# Patient Record
Sex: Female | Born: 1980 | Race: Black or African American | Hispanic: No | Marital: Married | Smoking: Former smoker
Health system: Southern US, Community
[De-identification: ages and names within clinical notes are randomized; demographics above are authoritative.]

## PROBLEM LIST (undated history)

## (undated) DIAGNOSIS — E119 Type 2 diabetes mellitus without complications: Secondary | ICD-10-CM

## (undated) DIAGNOSIS — D649 Anemia, unspecified: Secondary | ICD-10-CM

## (undated) DIAGNOSIS — Z862 Personal history of diseases of the blood and blood-forming organs and certain disorders involving the immune mechanism: Secondary | ICD-10-CM

## (undated) DIAGNOSIS — Z21 Asymptomatic human immunodeficiency virus [HIV] infection status: Secondary | ICD-10-CM

## (undated) DIAGNOSIS — L732 Hidradenitis suppurativa: Secondary | ICD-10-CM

## (undated) HISTORY — PX: WISDOM TOOTH EXTRACTION: SHX21

---
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
## 1998-01-16 ENCOUNTER — Inpatient Hospital Stay (HOSPITAL_COMMUNITY): Admission: AD | Admit: 1998-01-16 | Discharge: 1998-01-20 | Payer: Self-pay | Admitting: Obstetrics

## 1998-01-24 ENCOUNTER — Encounter (HOSPITAL_COMMUNITY): Admission: RE | Admit: 1998-01-24 | Discharge: 1998-02-03 | Payer: Self-pay | Admitting: *Deleted

## 1998-01-30 ENCOUNTER — Inpatient Hospital Stay (HOSPITAL_COMMUNITY): Admission: AD | Admit: 1998-01-30 | Discharge: 1998-02-04 | Payer: Self-pay | Admitting: Obstetrics

## 1998-03-12 ENCOUNTER — Emergency Department (HOSPITAL_COMMUNITY): Admission: EM | Admit: 1998-03-12 | Discharge: 1998-03-12 | Payer: Self-pay | Admitting: Emergency Medicine

## 1998-03-25 ENCOUNTER — Inpatient Hospital Stay (HOSPITAL_COMMUNITY): Admission: EM | Admit: 1998-03-25 | Discharge: 1998-03-28 | Payer: Self-pay | Admitting: Emergency Medicine

## 1998-03-25 ENCOUNTER — Encounter: Payer: Self-pay | Admitting: Emergency Medicine

## 1998-03-26 ENCOUNTER — Encounter: Payer: Self-pay | Admitting: Gastroenterology

## 1998-11-05 ENCOUNTER — Emergency Department (HOSPITAL_COMMUNITY): Admission: EM | Admit: 1998-11-05 | Discharge: 1998-11-05 | Payer: Self-pay | Admitting: Emergency Medicine

## 1998-12-02 ENCOUNTER — Ambulatory Visit (HOSPITAL_COMMUNITY): Admission: RE | Admit: 1998-12-02 | Discharge: 1998-12-02 | Payer: Self-pay | Admitting: Obstetrics

## 1999-02-28 ENCOUNTER — Inpatient Hospital Stay (HOSPITAL_COMMUNITY): Admission: AD | Admit: 1999-02-28 | Discharge: 1999-02-28 | Payer: Self-pay | Admitting: *Deleted

## 1999-03-25 ENCOUNTER — Inpatient Hospital Stay (HOSPITAL_COMMUNITY): Admission: AD | Admit: 1999-03-25 | Discharge: 1999-03-25 | Payer: Self-pay | Admitting: *Deleted

## 1999-03-27 ENCOUNTER — Inpatient Hospital Stay (HOSPITAL_COMMUNITY): Admission: AD | Admit: 1999-03-27 | Discharge: 1999-03-29 | Payer: Self-pay | Admitting: Obstetrics & Gynecology

## 1999-10-06 ENCOUNTER — Emergency Department (HOSPITAL_COMMUNITY): Admission: EM | Admit: 1999-10-06 | Discharge: 1999-10-06 | Payer: Self-pay | Admitting: Emergency Medicine

## 1999-10-11 ENCOUNTER — Emergency Department (HOSPITAL_COMMUNITY): Admission: EM | Admit: 1999-10-11 | Discharge: 1999-10-11 | Payer: Self-pay | Admitting: Emergency Medicine

## 2000-04-22 ENCOUNTER — Emergency Department (HOSPITAL_COMMUNITY): Admission: EM | Admit: 2000-04-22 | Discharge: 2000-04-22 | Payer: Self-pay | Admitting: Emergency Medicine

## 2001-05-01 ENCOUNTER — Emergency Department (HOSPITAL_COMMUNITY): Admission: EM | Admit: 2001-05-01 | Discharge: 2001-05-01 | Payer: Self-pay | Admitting: *Deleted

## 2001-07-28 ENCOUNTER — Other Ambulatory Visit: Admission: RE | Admit: 2001-07-28 | Discharge: 2001-07-28 | Payer: Self-pay | Admitting: Obstetrics and Gynecology

## 2001-12-10 ENCOUNTER — Emergency Department (HOSPITAL_COMMUNITY): Admission: EM | Admit: 2001-12-10 | Discharge: 2001-12-10 | Payer: Self-pay

## 2001-12-10 ENCOUNTER — Encounter: Payer: Self-pay | Admitting: Emergency Medicine

## 2002-10-17 ENCOUNTER — Inpatient Hospital Stay (HOSPITAL_COMMUNITY): Admission: AD | Admit: 2002-10-17 | Discharge: 2002-10-17 | Payer: Self-pay | Admitting: Obstetrics and Gynecology

## 2002-10-17 ENCOUNTER — Encounter: Payer: Self-pay | Admitting: Obstetrics and Gynecology

## 2002-10-21 ENCOUNTER — Inpatient Hospital Stay (HOSPITAL_COMMUNITY): Admission: AD | Admit: 2002-10-21 | Discharge: 2002-10-21 | Payer: Self-pay | Admitting: Family Medicine

## 2004-01-15 ENCOUNTER — Ambulatory Visit (HOSPITAL_COMMUNITY): Admission: RE | Admit: 2004-01-15 | Discharge: 2004-01-15 | Payer: Self-pay | Admitting: Infectious Diseases

## 2004-01-15 ENCOUNTER — Ambulatory Visit: Payer: Self-pay | Admitting: Infectious Diseases

## 2004-01-15 ENCOUNTER — Encounter (INDEPENDENT_AMBULATORY_CARE_PROVIDER_SITE_OTHER): Payer: Self-pay | Admitting: *Deleted

## 2004-01-15 LAB — CONVERTED CEMR LAB: CD4 T Cell Abs: 400

## 2004-01-29 ENCOUNTER — Ambulatory Visit: Payer: Self-pay | Admitting: Infectious Diseases

## 2004-04-21 ENCOUNTER — Ambulatory Visit: Payer: Self-pay | Admitting: Infectious Diseases

## 2004-04-21 ENCOUNTER — Ambulatory Visit (HOSPITAL_COMMUNITY): Admission: RE | Admit: 2004-04-21 | Discharge: 2004-04-21 | Payer: Self-pay | Admitting: Infectious Diseases

## 2004-06-29 ENCOUNTER — Ambulatory Visit: Payer: Self-pay | Admitting: Internal Medicine

## 2004-07-13 ENCOUNTER — Ambulatory Visit (HOSPITAL_COMMUNITY): Admission: RE | Admit: 2004-07-13 | Discharge: 2004-07-13 | Payer: Self-pay | Admitting: Infectious Diseases

## 2004-07-13 ENCOUNTER — Ambulatory Visit: Payer: Self-pay | Admitting: Infectious Diseases

## 2004-08-24 ENCOUNTER — Ambulatory Visit: Payer: Self-pay | Admitting: Infectious Diseases

## 2004-10-12 ENCOUNTER — Ambulatory Visit: Payer: Self-pay | Admitting: Infectious Diseases

## 2004-10-12 ENCOUNTER — Ambulatory Visit (HOSPITAL_COMMUNITY): Admission: RE | Admit: 2004-10-12 | Discharge: 2004-10-12 | Payer: Self-pay | Admitting: Infectious Diseases

## 2005-01-11 ENCOUNTER — Ambulatory Visit (HOSPITAL_COMMUNITY): Admission: RE | Admit: 2005-01-11 | Discharge: 2005-01-11 | Payer: Self-pay | Admitting: Infectious Diseases

## 2005-01-11 ENCOUNTER — Ambulatory Visit: Payer: Self-pay | Admitting: Infectious Diseases

## 2005-01-25 ENCOUNTER — Ambulatory Visit: Payer: Self-pay | Admitting: Infectious Diseases

## 2005-04-14 ENCOUNTER — Ambulatory Visit: Payer: Self-pay | Admitting: Infectious Diseases

## 2005-04-14 ENCOUNTER — Ambulatory Visit (HOSPITAL_COMMUNITY): Admission: RE | Admit: 2005-04-14 | Discharge: 2005-04-14 | Payer: Self-pay | Admitting: Infectious Diseases

## 2005-04-26 ENCOUNTER — Ambulatory Visit: Payer: Self-pay | Admitting: Infectious Diseases

## 2005-05-25 ENCOUNTER — Ambulatory Visit: Payer: Self-pay | Admitting: *Deleted

## 2005-05-25 ENCOUNTER — Ambulatory Visit (HOSPITAL_COMMUNITY): Admission: RE | Admit: 2005-05-25 | Discharge: 2005-05-25 | Payer: Self-pay | Admitting: *Deleted

## 2005-06-09 ENCOUNTER — Ambulatory Visit: Payer: Self-pay | Admitting: Infectious Diseases

## 2005-06-10 ENCOUNTER — Ambulatory Visit: Payer: Self-pay | Admitting: *Deleted

## 2005-07-20 ENCOUNTER — Inpatient Hospital Stay (HOSPITAL_COMMUNITY): Admission: AD | Admit: 2005-07-20 | Discharge: 2005-07-20 | Payer: Self-pay | Admitting: *Deleted

## 2005-08-02 ENCOUNTER — Ambulatory Visit: Payer: Self-pay | Admitting: Family Medicine

## 2005-08-03 ENCOUNTER — Ambulatory Visit (HOSPITAL_COMMUNITY): Admission: RE | Admit: 2005-08-03 | Discharge: 2005-08-03 | Payer: Self-pay | Admitting: *Deleted

## 2005-08-04 ENCOUNTER — Encounter: Admission: RE | Admit: 2005-08-04 | Discharge: 2005-08-04 | Payer: Self-pay | Admitting: Infectious Diseases

## 2005-08-04 ENCOUNTER — Encounter (INDEPENDENT_AMBULATORY_CARE_PROVIDER_SITE_OTHER): Payer: Self-pay | Admitting: *Deleted

## 2005-08-04 ENCOUNTER — Ambulatory Visit: Payer: Self-pay | Admitting: Infectious Diseases

## 2005-08-04 LAB — CONVERTED CEMR LAB
CD4 Count: 690 microliters
HIV 1 RNA Quant: 399 copies/mL

## 2005-08-16 ENCOUNTER — Ambulatory Visit: Payer: Self-pay | Admitting: Family Medicine

## 2005-09-06 ENCOUNTER — Ambulatory Visit: Payer: Self-pay | Admitting: Family Medicine

## 2005-09-07 ENCOUNTER — Ambulatory Visit: Payer: Self-pay | Admitting: Infectious Diseases

## 2005-09-20 ENCOUNTER — Ambulatory Visit: Payer: Self-pay | Admitting: Obstetrics & Gynecology

## 2005-09-21 ENCOUNTER — Ambulatory Visit (HOSPITAL_COMMUNITY): Admission: RE | Admit: 2005-09-21 | Discharge: 2005-09-21 | Payer: Self-pay | Admitting: Obstetrics & Gynecology

## 2005-09-21 ENCOUNTER — Encounter: Admission: RE | Admit: 2005-09-21 | Discharge: 2005-11-20 | Payer: Self-pay | Admitting: Family Medicine

## 2005-10-11 ENCOUNTER — Ambulatory Visit: Payer: Self-pay | Admitting: Obstetrics & Gynecology

## 2005-10-25 ENCOUNTER — Ambulatory Visit: Payer: Self-pay | Admitting: Gynecology

## 2005-10-26 ENCOUNTER — Encounter: Admission: RE | Admit: 2005-10-26 | Discharge: 2005-10-26 | Payer: Self-pay | Admitting: Infectious Diseases

## 2005-10-26 ENCOUNTER — Encounter (INDEPENDENT_AMBULATORY_CARE_PROVIDER_SITE_OTHER): Payer: Self-pay | Admitting: *Deleted

## 2005-10-26 ENCOUNTER — Ambulatory Visit: Payer: Self-pay | Admitting: Infectious Diseases

## 2005-10-26 LAB — CONVERTED CEMR LAB: CD4 Count: 620 microliters

## 2005-10-31 ENCOUNTER — Ambulatory Visit: Payer: Self-pay | Admitting: Family Medicine

## 2005-10-31 ENCOUNTER — Inpatient Hospital Stay (HOSPITAL_COMMUNITY): Admission: AD | Admit: 2005-10-31 | Discharge: 2005-10-31 | Payer: Self-pay | Admitting: Obstetrics and Gynecology

## 2005-11-01 ENCOUNTER — Ambulatory Visit: Payer: Self-pay | Admitting: Gynecology

## 2005-11-15 ENCOUNTER — Ambulatory Visit: Payer: Self-pay | Admitting: Obstetrics & Gynecology

## 2005-11-19 ENCOUNTER — Ambulatory Visit: Payer: Self-pay | Admitting: Obstetrics & Gynecology

## 2005-11-20 ENCOUNTER — Inpatient Hospital Stay (HOSPITAL_COMMUNITY): Admission: AD | Admit: 2005-11-20 | Discharge: 2005-11-23 | Payer: Self-pay | Admitting: Obstetrics & Gynecology

## 2005-11-20 ENCOUNTER — Ambulatory Visit: Payer: Self-pay | Admitting: Obstetrics & Gynecology

## 2005-11-21 ENCOUNTER — Encounter (INDEPENDENT_AMBULATORY_CARE_PROVIDER_SITE_OTHER): Payer: Self-pay | Admitting: *Deleted

## 2005-11-22 HISTORY — PX: TUBAL LIGATION: SHX77

## 2006-01-24 ENCOUNTER — Encounter (INDEPENDENT_AMBULATORY_CARE_PROVIDER_SITE_OTHER): Payer: Self-pay | Admitting: *Deleted

## 2006-01-24 ENCOUNTER — Encounter: Admission: RE | Admit: 2006-01-24 | Discharge: 2006-01-24 | Payer: Self-pay | Admitting: Infectious Diseases

## 2006-01-24 ENCOUNTER — Ambulatory Visit: Payer: Self-pay | Admitting: Infectious Diseases

## 2006-01-24 LAB — CONVERTED CEMR LAB
ALT: 17 units/L (ref 0–40)
Albumin: 4.4 g/dL (ref 3.5–5.2)
Alkaline Phosphatase: 84 units/L (ref 39–117)
Basophils Absolute: 0 10*3/uL (ref 0.0–0.1)
Basophils percent auto: 0 % (ref 0–1)
CO2: 25 meq/L (ref 19–32)
Calcium: 9.5 mg/dL (ref 8.4–10.5)
Chloride: 102 meq/L (ref 96–112)
Eosinophils Absolute: 0.2 cells/mcL (ref 0.0–0.7)
Glucose, Bld: 122 mg/dL — ABNORMAL HIGH (ref 70–99)
HIV 1 RNA Quant: 49 copies/mL
HIV 1 RNA Quant: <50 copies/mL (ref <50)
HIV-1 RNA Quant, Log: <1.7 (ref <1.70)
Hemoglobin: 12.4 g/dL (ref 12.0–15.0)
Ketones, ur: NEGATIVE mg/dL
Lymphocytes Relative: 37 % (ref 12–46)
MCHC: 33.6 g/dL (ref 30.0–36.0)
Monocytes Relative: 11 % (ref 3–11)
Neutrophils Relative %: 49 % (ref 43–77)
Potassium: 4.1 meq/L (ref 3.5–5.3)
RBC: 3.73 M/uL — ABNORMAL LOW (ref 3.87–5.11)
Specific Gravity, Urine: 1.007 (ref 1.005–1.03)

## 2006-02-07 ENCOUNTER — Ambulatory Visit: Payer: Self-pay | Admitting: Infectious Diseases

## 2006-02-12 DIAGNOSIS — R8789 Other abnormal findings in specimens from female genital organs: Secondary | ICD-10-CM

## 2006-02-12 DIAGNOSIS — F17200 Nicotine dependence, unspecified, uncomplicated: Secondary | ICD-10-CM

## 2006-02-12 DIAGNOSIS — B20 Human immunodeficiency virus [HIV] disease: Secondary | ICD-10-CM

## 2006-05-30 ENCOUNTER — Encounter (INDEPENDENT_AMBULATORY_CARE_PROVIDER_SITE_OTHER): Payer: Self-pay | Admitting: *Deleted

## 2006-05-30 LAB — CONVERTED CEMR LAB

## 2006-06-01 IMAGING — US US OB COMP LESS 14 WK
1 series · 14 of 18 positions shown · non-contrast
Comparison: none

CLINICAL DATA: Fetal heart tones not detected; estimated gestational age by LMP is 10 weeks 0 days.

[Series 1: us ob comp less 14 wk · 14 of 18 slices shown]
[im 1/18]
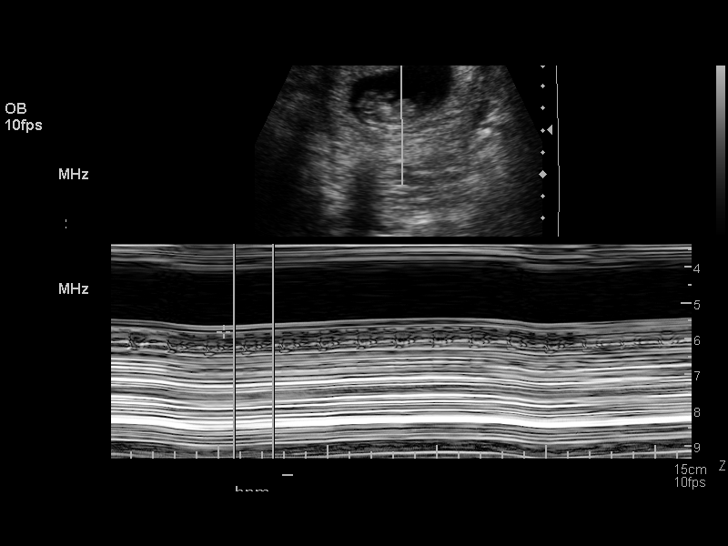
[im 2/18]
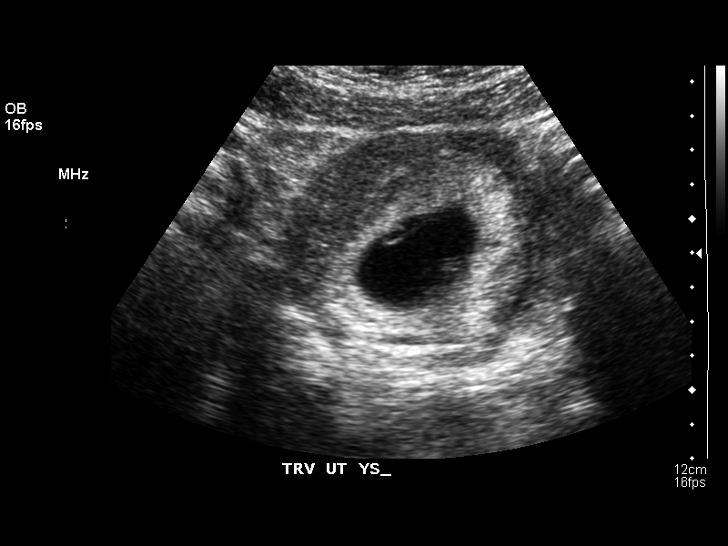
[im 4/18]
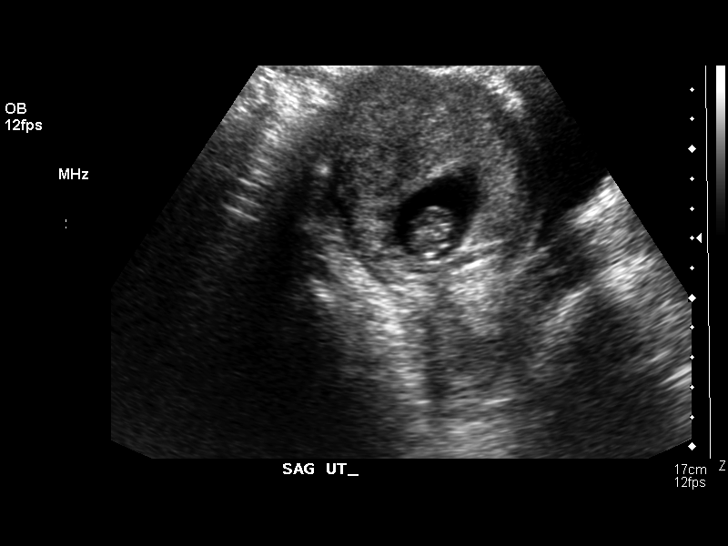
[im 5/18]
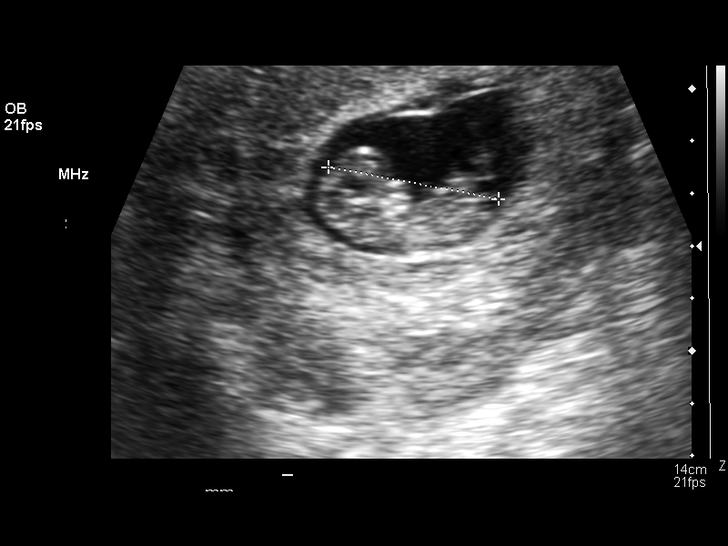
[im 6/18]
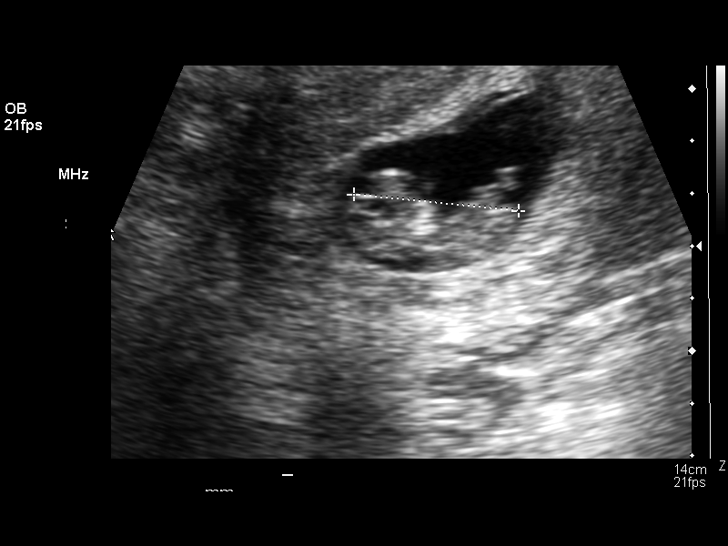
[im 8/18]
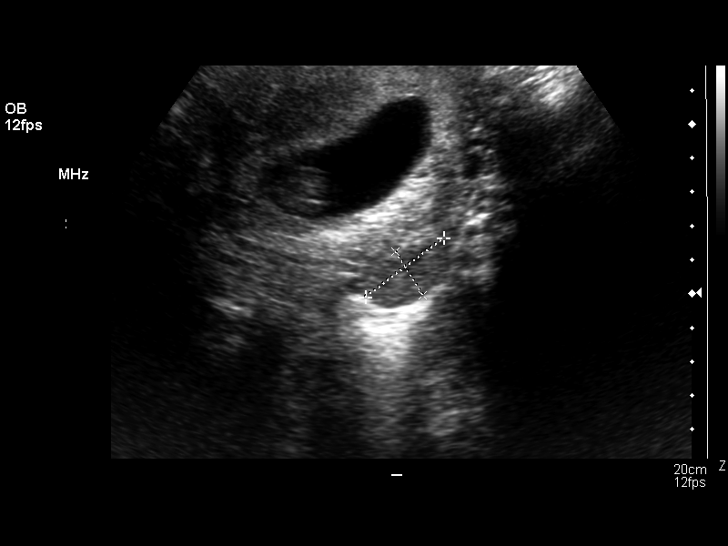
[im 9/18]
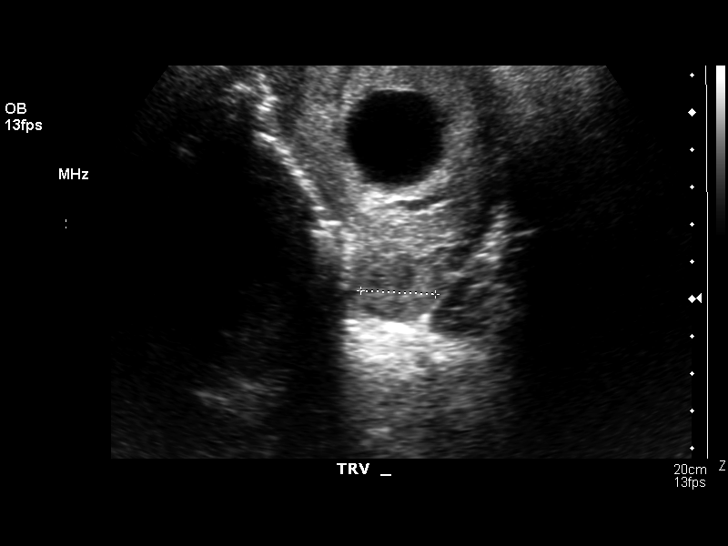
[im 10/18]
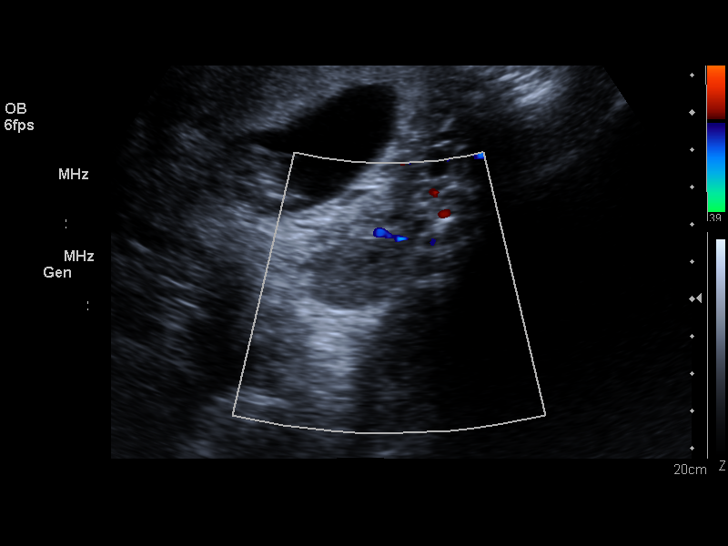
[im 11/18]
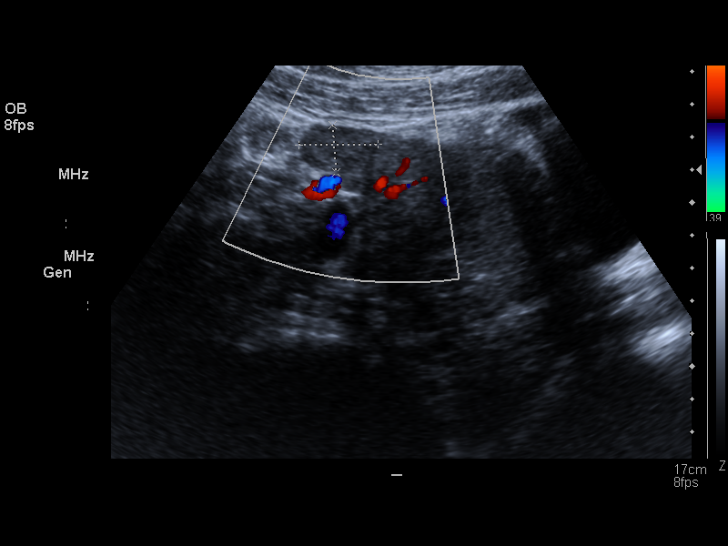
[im 13/18]
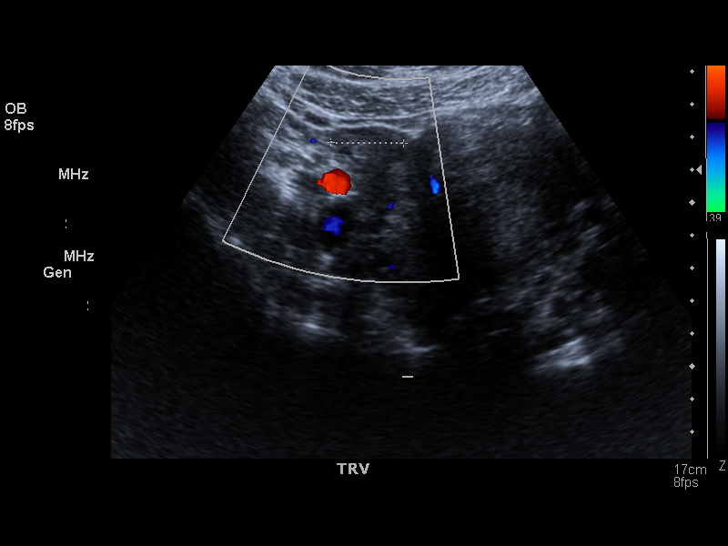
[im 14/18]
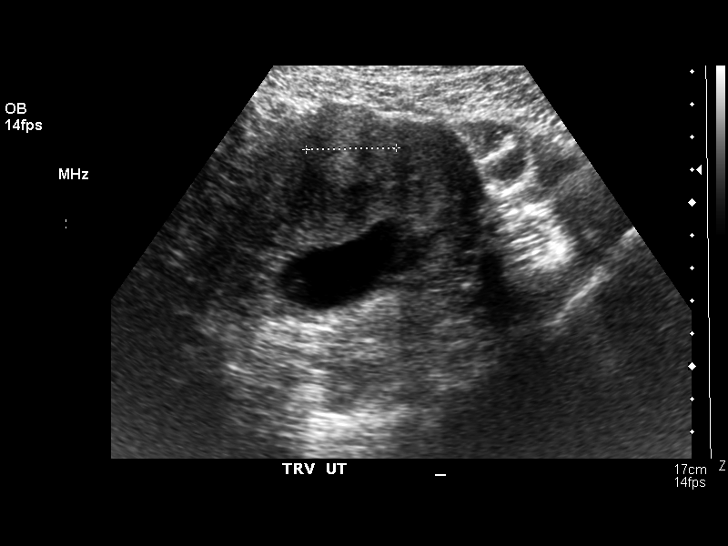
[im 15/18]
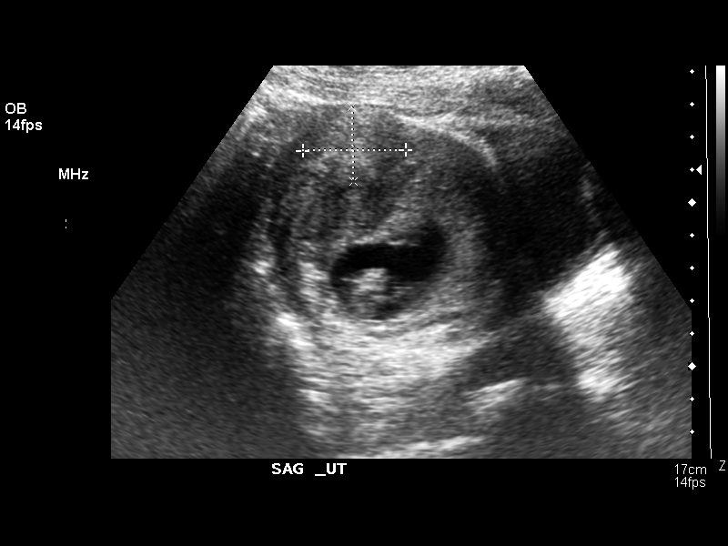
[im 17/18]
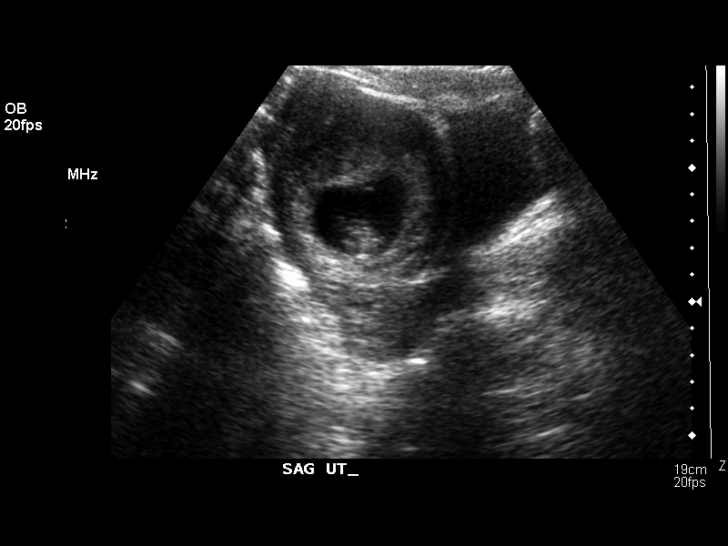
[im 18/18]
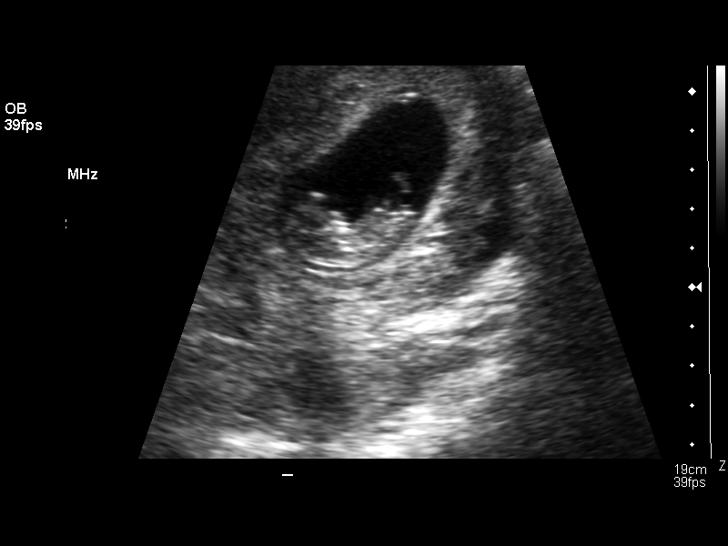

[14 of 18 positions shown; findings below may reference images not displayed]

OBSTETRICAL ULTRASOUND:

 Number of Fetuses:  1
 Heart Rate:  168
 Presentation:  Variable.
 Amniotic fluid:  Normal
 CRL:  3.2 cm  10 w 1 d
 Ultrasound EDC:  12/20/05

 Fetal anatomy could not be evaluated due to the early gestational age. Yolk sac is visualized.

 MATERNAL UTERINE AND ADNEXAL FINDINGS
 Cervix not evaluated.  
 Right ovary measures 2.4 x 1.5 x 2.2 cm and left ovary measures 2.9 x 1.5 x 2.0 cm.  Incidental note is made of 3.1 cm myometrial fibroid at the anterior fundus.
IMPRESSION: There is a single living intrauterine gestation.  The crown rump length indicates a gestational age of 10 weeks 1 day which is concordant with the estimated gestational age by LMP.

## 2006-06-12 ENCOUNTER — Encounter (INDEPENDENT_AMBULATORY_CARE_PROVIDER_SITE_OTHER): Payer: Self-pay | Admitting: *Deleted

## 2006-08-10 IMAGING — US US OB COMP +14 WK
1 series · 13 of 28 positions shown · non-contrast
Comparison: none

CLINICAL DATA: 20 week 0 day gestational age by LMP and early ultrasound.  Evaluate fetal anatomy and growth.

[Series 1: us ob comp +14 wk · 0.33mm/px · 13 of 80 slices shown]
[im 3/80]
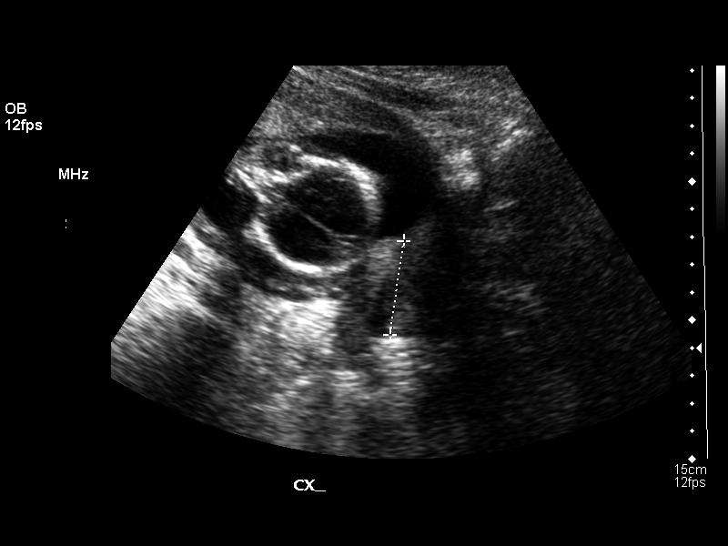
[im 9/80]
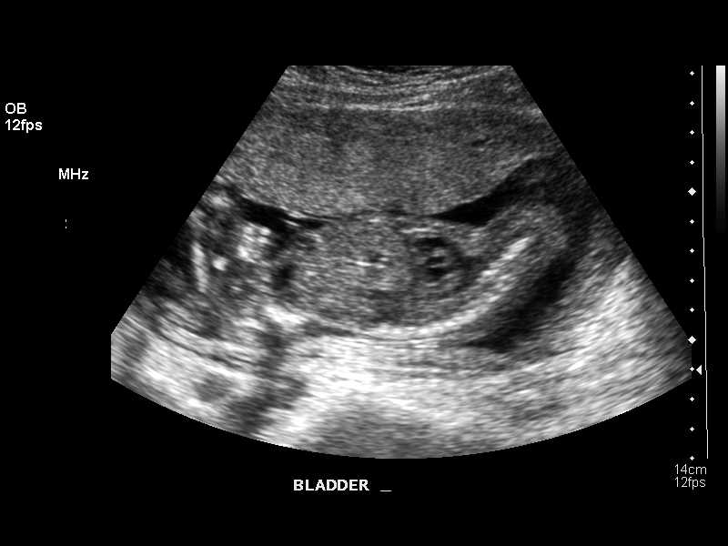
[im 15/80]
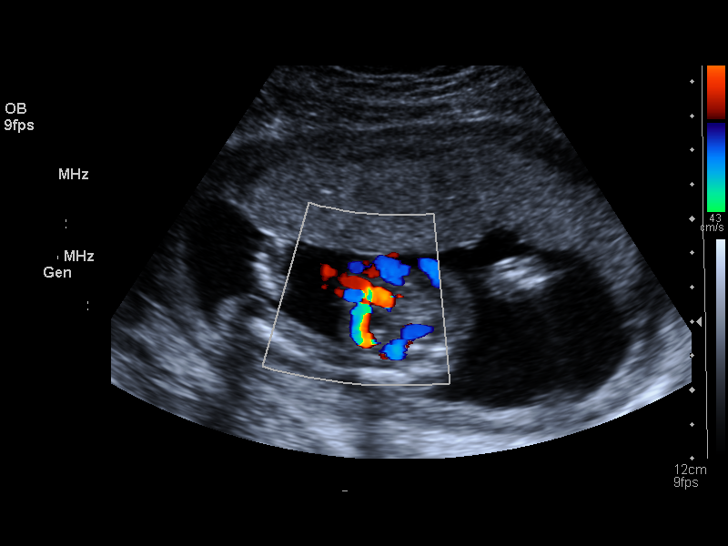
[im 21/80]
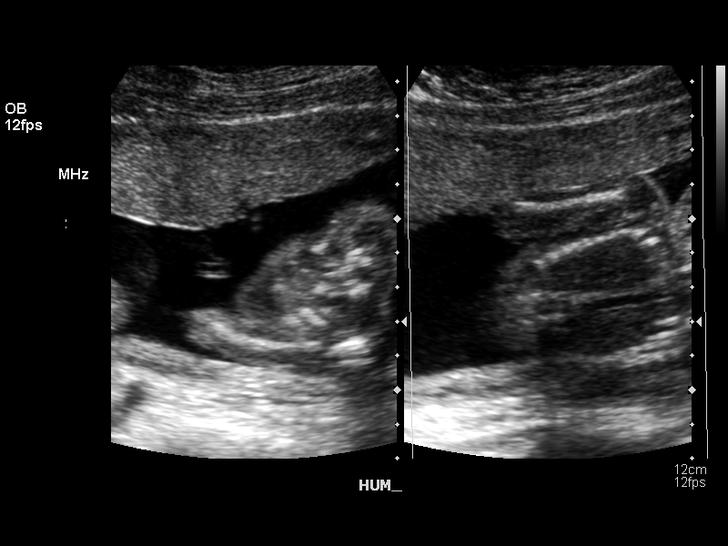
[im 27/80]
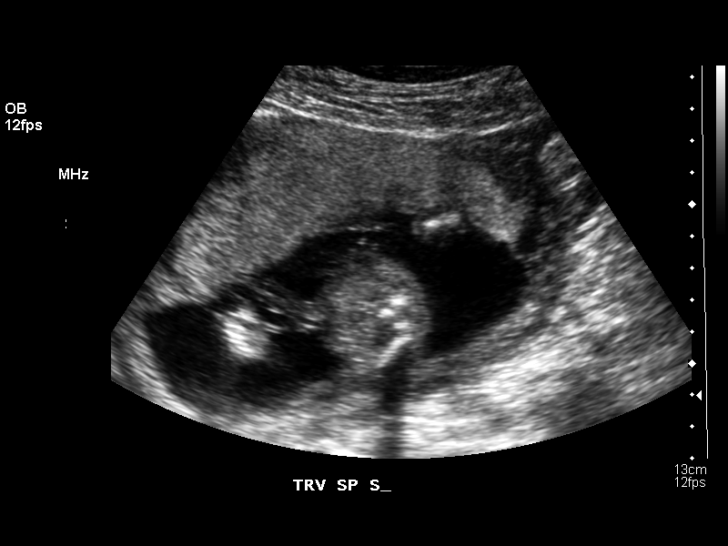
[im 33/80]
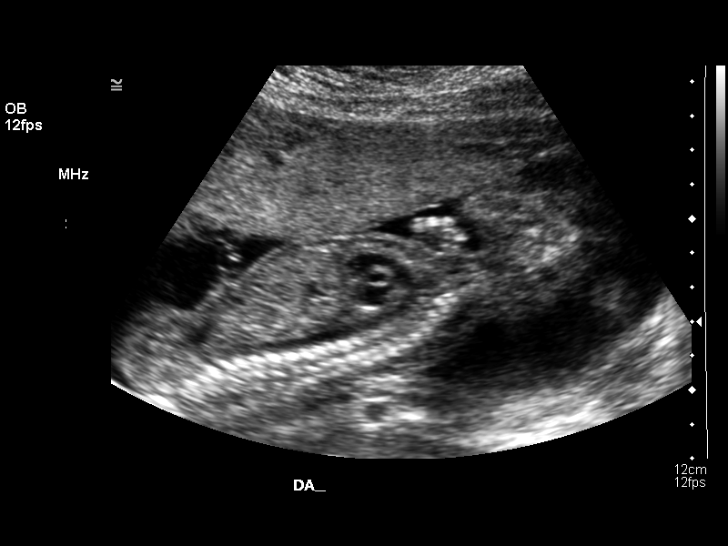
[im 41/80]
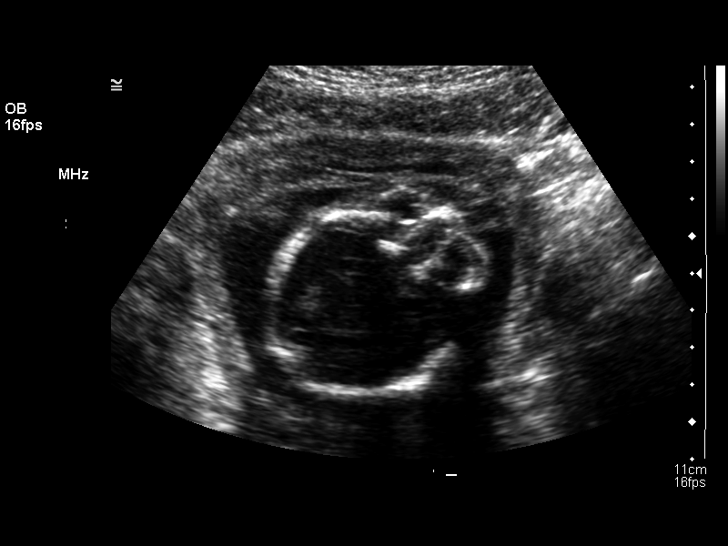
[im 47/80]
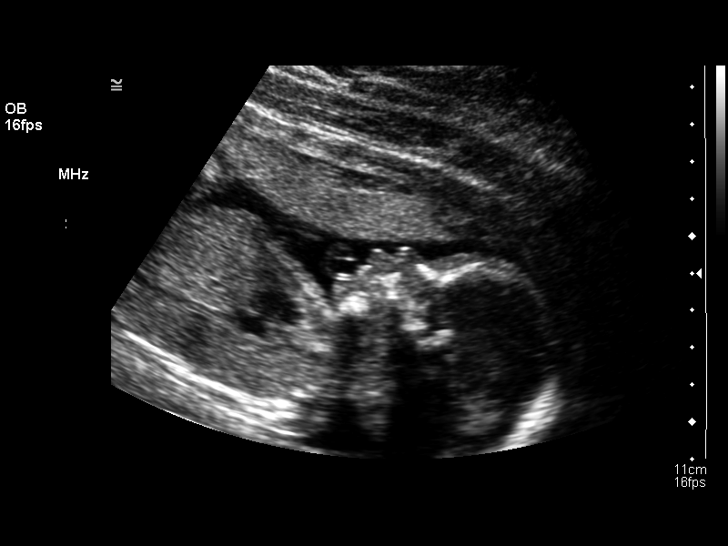
[im 53/80]
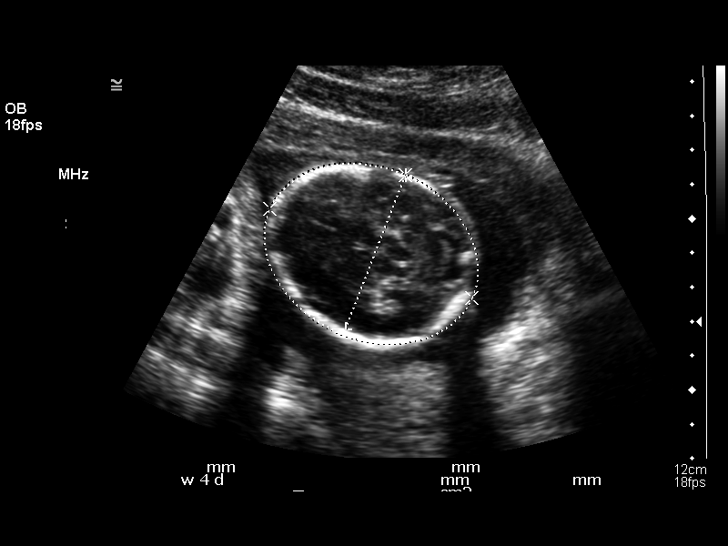
[im 59/80]
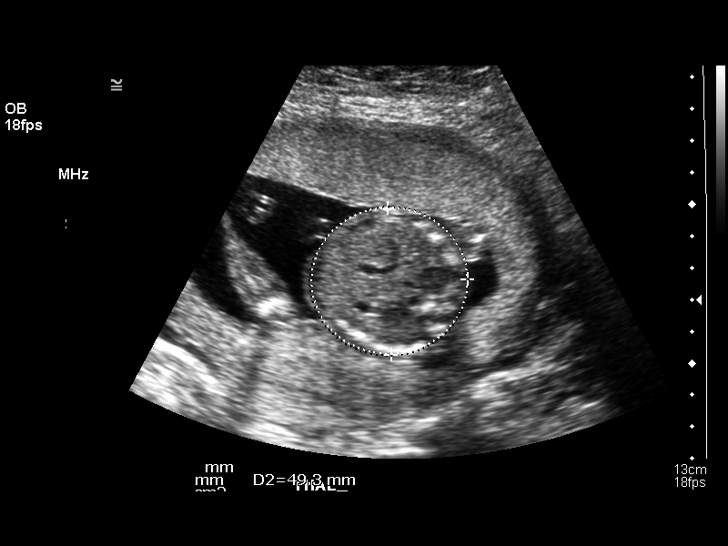
[im 65/80]
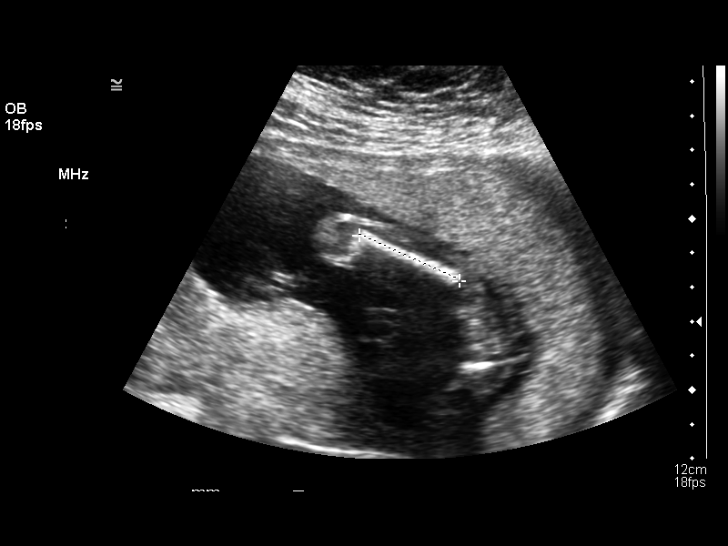
[im 71/80]
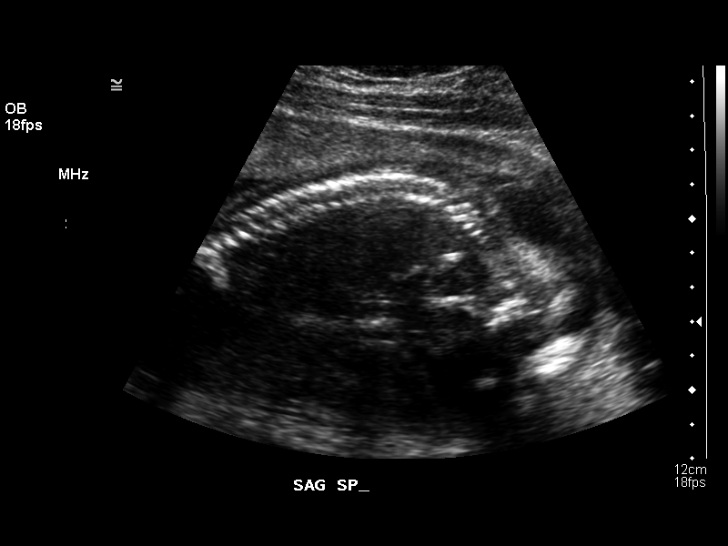
[im 77/80]
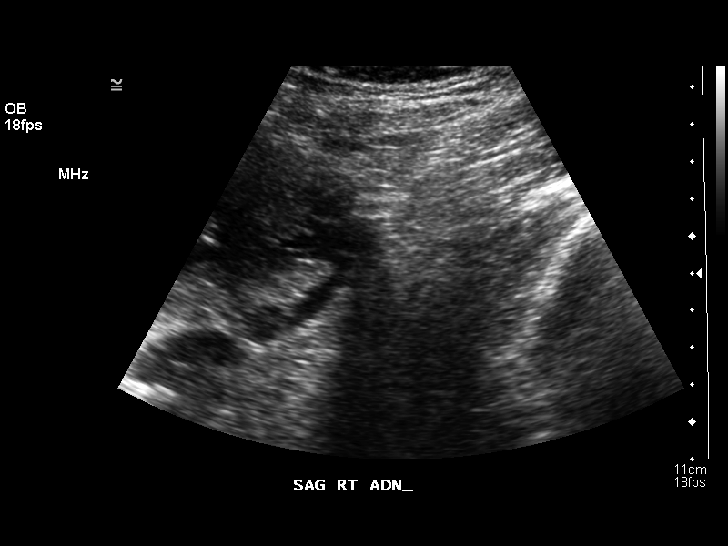

[13 of 28 positions shown; findings below may reference images not displayed]

OBSTETRICAL ULTRASOUND:
Number of Fetuses:  1
Heart Rate:  153
Movement:  Yes
Breathing:  No  
Presentation:  Cephalic
Placental Location:  Anterior
Grade:  0
Previa:  No
Amniotic Fluid (Subjective):  Normal
Amniotic Fluid (Objective):   4.5 cm Vertical pocket 

FETAL BIOMETRY
BPD:  4.7 cm   20 w 5 d
HC:  18.0 cm   20 w 3 d
AC:  14.6 cm   19 w 6 d
FL:    3.2 cm   20 w 0 d

MEAN GA:  20 w 2 d  US EDC:  12/19/05
Assigned GA:  20 w 0 d  Assigned EDC:  12/21/05    

FETAL ANATOMY
Lateral Ventricles:    Visualized 
Thalami/CSP:      Visualized 
Posterior Fossa:  Visualized 
Nuchal Region:    Visualized 
Spine:      Visualized 
4 Chamber Heart on Left:      Visualized 
Stomach on Left:      Visualized 
3 Vessel Cord:    Visualized 
Cord Insertion site:    Visualized 
Kidneys:  Visualized 
Bladder:  Visualized 
Extremities:      Visualized 

ADDITIONAL ANATOMY VISUALIZED:  LVOT, RVOT, upper lip, orbits, profile, diaphragm, heel, 5th digit, ductal arch, and male genitalia.

MATERNAL UTERINE AND ADNEXAL FINDINGS
Cervix: 3.4 cm Transabdominally.  Both ovaries are unremarkable.
IMPRESSION: 1.  Assigned gestational age is currently 20 weeks 0 days.  Appropriate fetal growth.  
2.  No evidence of fetal anatomic abnormality.

## 2008-06-12 ENCOUNTER — Emergency Department (HOSPITAL_COMMUNITY): Admission: EM | Admit: 2008-06-12 | Discharge: 2008-06-12 | Payer: Self-pay | Admitting: *Deleted

## 2008-06-14 ENCOUNTER — Emergency Department (HOSPITAL_COMMUNITY): Admission: EM | Admit: 2008-06-14 | Discharge: 2008-06-14 | Payer: Self-pay | Admitting: Family Medicine

## 2008-12-16 ENCOUNTER — Emergency Department (HOSPITAL_COMMUNITY): Admission: EM | Admit: 2008-12-16 | Discharge: 2008-12-16 | Payer: Self-pay | Admitting: Family Medicine

## 2010-04-26 ENCOUNTER — Encounter: Payer: Self-pay | Admitting: *Deleted

## 2010-07-16 LAB — CBC
HCT: 34 % — ABNORMAL LOW (ref 36.0–46.0)
Hemoglobin: 11.8 g/dL — ABNORMAL LOW (ref 12.0–15.0)
MCHC: 34.8 g/dL (ref 30.0–36.0)
RBC: 4.37 MIL/uL (ref 3.87–5.11)
RDW: 14.8 % (ref 11.5–15.5)
WBC: 6 10*3/uL (ref 4.0–10.5)

## 2010-07-16 LAB — GRAM STAIN

## 2010-07-16 LAB — DIFFERENTIAL
Eosinophils Absolute: 0.1 10*3/uL (ref 0.0–0.7)
Neutro Abs: 3.2 10*3/uL (ref 1.7–7.7)

## 2010-07-16 LAB — POCT I-STAT, CHEM 8
Creatinine, Ser: 0.5 mg/dL (ref 0.4–1.2)
Glucose, Bld: 229 mg/dL — ABNORMAL HIGH (ref 70–99)
HCT: 38 % (ref 36.0–46.0)
Potassium: 3.5 mEq/L (ref 3.5–5.1)
TCO2: 25 mmol/L (ref 0–100)

## 2010-07-16 LAB — CULTURE, ROUTINE-ABSCESS

## 2010-08-21 NOTE — Op Note (Signed)
NAMELYNDEN, CARRITHERS NO.:  [id number]   MEDICAL RECORD NO.:  [id number]          PATIENT TYPE:  INP   LOCATION:  9110                          FACILITY:  [facility]   PHYSICIAN:  Ginger Carne, MD  DATE OF BIRTH:  [birth year]   DATE OF PROCEDURE:  11/22/2005  DATE OF DISCHARGE:                                 OPERATIVE REPORT   PREOPERATIVE DIAGNOSIS:  Request for sterilization.   POSTOPERATIVE DIAGNOSIS:   PROCEDURE:  [place] postpartum bilateral tubal ligation.   SURGEON:  Ginger Carne, MD   ASSISTANT:  None.   COMPLICATIONS:  None immediate.   ESTIMATED BLOOD LOSS:  Minimal.   SPECIMEN:  Portions of right and left tubes to pathology.   ANESTHESIA:  General.   OPERATIVE FINDINGS:  Uterus, tubes and ovaries showed normal decidual  changes of pregnancy.  Both tubes were identified from their isthmus to  fimbriated ends separate and apart from their respective round ligaments.   OPERATIVE PROCEDURE:  The patient prepped and draped in the usual fashion  and placed in the supine position.  Betadine solution used for antiseptic.  The patient had voided immediately prior to her surgery.  A vertical  infraumbilical incision was made and the abdomen opened.  Both tubes were  identified separate and apart from their respective round ligaments from  their isthmus to fimbriated ends.  Two to three centimeters of tube was  incorporated between two plain 0 sutures with two individual knots, tubes  then severed above said knots and tips cauterized, no active bleeding noted.  Following this, tubes sent to pathology.  Closure of the fascia and  peritoneum in one layer of 0 Vicryl running suture and 4-0 Monocryl for a  subcuticular closure.  Instrument and sponge count were correct.  The  patient tolerated the procedure well and returned to the post anesthesia  recovery room in excellent condition.      Ginger Carne, MD  Electronically Signed     SHB/MEDQ  D:  11/22/2005  T:  11/22/2005  Job:  [id number]

## 2012-02-04 ENCOUNTER — Telehealth: Payer: Self-pay

## 2012-02-04 NOTE — Telephone Encounter (Signed)
Pt calling stating she is ready to return to care. She has not been in the office since 2007. Message left on her voicemail. She will need to see me prior to scheduling appointment.   Pt will need intake.   Laurell Josephs, RN

## 2012-02-15 ENCOUNTER — Ambulatory Visit (INDEPENDENT_AMBULATORY_CARE_PROVIDER_SITE_OTHER): Payer: Managed Care, Other (non HMO)

## 2012-02-15 ENCOUNTER — Other Ambulatory Visit: Payer: Self-pay | Admitting: Internal Medicine

## 2012-02-15 DIAGNOSIS — B20 Human immunodeficiency virus [HIV] disease: Secondary | ICD-10-CM

## 2012-02-15 DIAGNOSIS — Z23 Encounter for immunization: Secondary | ICD-10-CM

## 2012-02-15 DIAGNOSIS — Z21 Asymptomatic human immunodeficiency virus [HIV] infection status: Secondary | ICD-10-CM

## 2012-02-15 LAB — COMPLETE METABOLIC PANEL WITH GFR
ALT: 13 U/L (ref 0–35)
AST: 16 U/L (ref 0–37)
Albumin: 4.1 g/dL (ref 3.5–5.2)
Alkaline Phosphatase: 80 U/L (ref 39–117)
BUN: 7 mg/dL (ref 6–23)
CO2: 22 mEq/L (ref 19–32)
Creat: 0.49 mg/dL — ABNORMAL LOW (ref 0.50–1.10)
Total Protein: 8 g/dL (ref 6.0–8.3)

## 2012-02-15 LAB — HEPATITIS B SURFACE ANTIGEN: Hepatitis B Surface Ag: NEGATIVE

## 2012-02-15 LAB — HEPATITIS C ANTIBODY: HCV Ab: NEGATIVE

## 2012-02-15 LAB — RPR

## 2012-02-15 LAB — LIPID PANEL
Total CHOL/HDL Ratio: 4.1 Ratio
VLDL: 27 mg/dL (ref 0–40)

## 2012-02-15 NOTE — Progress Notes (Signed)
Pt has been out of care for 7 years.  She is returning to have labs completed and to discuss with physician options of treatment or non treatment .   Prior to  pregnancy 6 years ago she was never on anti retrovirals.  Pt is married. Her spouse  tested last month and HIV test was negative. Pt states they rarely use condoms.  Labs done.  Vaccine given.   Pt gives history of recent TB skin test with employer, she has been asked to bring documentation or we will need to repeat testing.   Laurell Josephs, RN

## 2012-02-16 LAB — CBC WITH DIFFERENTIAL/PLATELET
Basophils Relative: 0 % (ref 0–1)
Eosinophils Absolute: 0.1 10*3/uL (ref 0.0–0.7)
Eosinophils Relative: 2 % (ref 0–5)
HCT: 27.6 % — ABNORMAL LOW (ref 36.0–46.0)
Lymphocytes Relative: 42 % (ref 12–46)
MCHC: 30.8 g/dL (ref 30.0–36.0)
MCV: 65.2 fL — ABNORMAL LOW (ref 78.0–100.0)
Monocytes Relative: 9 % (ref 3–12)
Neutro Abs: 2.3 10*3/uL (ref 1.7–7.7)
Platelets: 446 10*3/uL — ABNORMAL HIGH (ref 150–400)
RBC: 4.23 MIL/uL (ref 3.87–5.11)

## 2012-02-16 LAB — URINALYSIS
Ketones, ur: NEGATIVE mg/dL
Nitrite: NEGATIVE

## 2012-02-16 LAB — T-HELPER CELL (CD4) - (RCID CLINIC ONLY): CD4 T Cell Abs: 200 uL — ABNORMAL LOW (ref 400–2700)

## 2012-02-16 LAB — HIV-1 RNA ULTRAQUANT REFLEX TO GENTYP+: HIV 1 RNA Quant: 29981 copies/mL — ABNORMAL HIGH (ref <20)

## 2012-02-16 LAB — HEPATITIS A ANTIBODY, TOTAL: Hep A Total Ab: NEGATIVE

## 2012-02-21 LAB — HIV-1 GENOTYPR PLUS

## 2012-03-01 ENCOUNTER — Ambulatory Visit (INDEPENDENT_AMBULATORY_CARE_PROVIDER_SITE_OTHER): Payer: Managed Care, Other (non HMO) | Admitting: Internal Medicine

## 2012-03-01 ENCOUNTER — Encounter: Payer: Self-pay | Admitting: Internal Medicine

## 2012-03-01 VITALS — BP 115/80 | HR 118 | Temp 98.3°F | Ht 63.0 in | Wt 202.0 lb

## 2012-03-01 DIAGNOSIS — Z23 Encounter for immunization: Secondary | ICD-10-CM

## 2012-03-01 DIAGNOSIS — B20 Human immunodeficiency virus [HIV] disease: Secondary | ICD-10-CM

## 2012-03-01 MED ORDER — ELVITEG-COBIC-EMTRICIT-TENOFDF 150-150-200-300 MG PO TABS: 1.0000 | ORAL_TABLET | Freq: Every day | ORAL | Status: DC
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
## 2012-03-01 NOTE — Assessment & Plan Note (Addendum)
He is ready for treatment and I discussed the different treatment options and side effects and she will start start Stribild.  She will have her labs checked in one month and I will see her one to 2 weeks after that. She knows to call or she has any concerns after starting or with getting the medications. She knows to use condoms with sexual activity.  I also have recommended she get a primary care physician since her blood sugar is high and other issues.   Than 45 minutes was spent with this patient including face-to-face contact and coordination of care

## 2012-03-01 NOTE — Addendum Note (Signed)
Addended by: Wendall Mola A on: 03/01/2012 03:15 PM   Modules accepted: Orders

## 2012-04-03 ENCOUNTER — Telehealth: Payer: Self-pay | Admitting: *Deleted

## 2012-04-03 ENCOUNTER — Other Ambulatory Visit: Payer: Self-pay | Admitting: Internal Medicine

## 2012-04-03 ENCOUNTER — Other Ambulatory Visit (INDEPENDENT_AMBULATORY_CARE_PROVIDER_SITE_OTHER): Payer: Managed Care, Other (non HMO)

## 2012-04-03 DIAGNOSIS — B20 Human immunodeficiency virus [HIV] disease: Secondary | ICD-10-CM

## 2012-04-03 NOTE — Telephone Encounter (Signed)
Patient did not have a menstrual cycle in December and she is usually very regular. She has taken a pregnancy test which was negative, she has recently started Stribild and wonders if this could be a side effect. Wendall Mola CMA

## 2012-04-04 LAB — CBC WITH DIFFERENTIAL/PLATELET
Basophils Absolute: 0 10*3/uL (ref 0.0–0.1)
Basophils Relative: 1 % (ref 0–1)
Eosinophils Absolute: 0.1 10*3/uL (ref 0.0–0.7)
HCT: 29.8 % — ABNORMAL LOW (ref 36.0–46.0)
Hemoglobin: 8.9 g/dL — ABNORMAL LOW (ref 12.0–15.0)
Lymphocytes Relative: 39 % (ref 12–46)
Lymphs Abs: 2.5 10*3/uL (ref 0.7–4.0)
MCHC: 29.9 g/dL — ABNORMAL LOW (ref 30.0–36.0)
MCV: 64.9 fL — ABNORMAL LOW (ref 78.0–100.0)
Monocytes Absolute: 0.5 10*3/uL (ref 0.1–1.0)
Neutro Abs: 3.2 10*3/uL (ref 1.7–7.7)
Platelets: 484 10*3/uL — ABNORMAL HIGH (ref 150–400)
RBC: 4.59 MIL/uL (ref 3.87–5.11)
WBC: 6.3 10*3/uL (ref 4.0–10.5)

## 2012-04-04 LAB — COMPLETE METABOLIC PANEL WITH GFR
AST: 13 U/L (ref 0–37)
Albumin: 3.9 g/dL (ref 3.5–5.2)
Calcium: 8.7 mg/dL (ref 8.4–10.5)
GFR, Est African American: >89 mL/min
GFR, Est Non African American: >89 mL/min
Glucose, Bld: 237 mg/dL — ABNORMAL HIGH (ref 70–99)
Total Protein: 7.6 g/dL (ref 6.0–8.3)

## 2012-04-04 LAB — HIV-1 RNA QUANT-NO REFLEX-BLD: HIV 1 RNA Quant: <20 copies/mL (ref <20)

## 2012-04-06 ENCOUNTER — Telehealth: Payer: Self-pay | Admitting: *Deleted

## 2012-04-06 ENCOUNTER — Other Ambulatory Visit: Payer: Self-pay | Admitting: *Deleted

## 2012-04-06 DIAGNOSIS — D649 Anemia, unspecified: Secondary | ICD-10-CM

## 2012-04-06 LAB — IRON AND TIBC: UIBC: 559 ug/dL — ABNORMAL HIGH (ref 125–400)

## 2012-04-06 MED ORDER — FERROUS SULFATE 325 (65 FE) MG PO TABS: 325.0000 mg | ORAL_TABLET | Freq: Two times a day (BID) | ORAL | Status: DC

## 2012-04-06 NOTE — Telephone Encounter (Signed)
Message copied by Macy Mis on Thu Apr 06, 2012  4:42 PM ------      Message from: Gardiner Barefoot      Created: Thu Apr 06, 2012  4:19 PM       She has iron deficiency anemia.  Please have her start Ferrous Sulfate 325 mg po twice a day.  Can cause constipation and dark stools.  She needs a primary.

## 2012-04-06 NOTE — Telephone Encounter (Signed)
Patient notified and Rx sent to [facility] Wendall Mola CMA

## 2012-04-13 ENCOUNTER — Ambulatory Visit (INDEPENDENT_AMBULATORY_CARE_PROVIDER_SITE_OTHER): Payer: Managed Care, Other (non HMO) | Admitting: Internal Medicine

## 2012-04-13 ENCOUNTER — Encounter: Payer: Self-pay | Admitting: Internal Medicine

## 2012-04-13 VITALS — BP 120/74 | HR 111 | Temp 98.1°F | Ht 63.0 in | Wt 211.0 lb

## 2012-04-13 DIAGNOSIS — E119 Type 2 diabetes mellitus without complications: Secondary | ICD-10-CM | POA: Insufficient documentation

## 2012-04-13 DIAGNOSIS — B20 Human immunodeficiency virus [HIV] disease: Secondary | ICD-10-CM

## 2012-04-13 DIAGNOSIS — L732 Hidradenitis suppurativa: Secondary | ICD-10-CM

## 2012-04-13 MED ORDER — CLINDAMYCIN HCL 300 MG PO CAPS: 300.0000 mg | ORAL_CAPSULE | Freq: Three times a day (TID) | ORAL | Status: DC

## 2012-04-13 MED ORDER — METFORMIN HCL 500 MG PO TABS: 500.0000 mg | ORAL_TABLET | Freq: Two times a day (BID) | ORAL | Status: DC

## 2012-04-13 MED ORDER — METFORMIN HCL 500 MG PO TABS: 500.0000 mg | ORAL_TABLET | Freq: Every day | ORAL | Status: DC

## 2012-04-13 MED ORDER — FLUCONAZOLE 150 MG PO TABS: 150.0000 mg | ORAL_TABLET | Freq: Once | ORAL | Status: DC

## 2012-04-13 NOTE — Assessment & Plan Note (Signed)
I suspect her axillary growths are secondary to hidradenitis. This is based on the fact she has had these several times before and always in the groin or axilla. She has been referred to a surgeon to consider incision and drainage of this. I also did give her some clindamycin in case this is infection however I suspect a culture of the area will show no growth which would be consistent with hidradenitis. She will need further encouragement for weight loss.

## 2012-04-13 NOTE — Patient Instructions (Signed)
Get the iron, exercise, eat less sugar

## 2012-04-13 NOTE — Assessment & Plan Note (Signed)
She does have symptoms of diabetes and her blood sugar is over 200. I am going to start her on metformin. I will check her creatinine and LFTs in about 2 weeks and I will see her in one month. I will also check her hemoglobin A1c at this time. She again was encouraged to follow up with primary doctor to better manage her diabetes and obtain appropriate diabetes supplies.

## 2012-04-13 NOTE — Assessment & Plan Note (Addendum)
She is doing well and she'll continue with her current regimen. I will followup with repeat labs in about 3 months and  More than 45 minutes was spent in counseling the patient, exam and coordination of care.

## 2012-04-13 NOTE — Progress Notes (Signed)
  Subjective:    Patient ID: Melissa Cruz, female    DOB: 1980/10/08, 32 y.o.   MRN: [id number]  HPI She comes in for followup of her HIV. She was started on Stribild last visit and reports good compliance with no missed doses. Her labs confirm this with now undetectable viral load. She has no side effects and otherwise feels well. She is pleased with the results. She does have a complaint of what she calls an abscess growing on her right axillary area as well as a small one on the left side. She states she has had these before both in the axilla as well as the groins on both sides. Generally they are lanced and she responds to therapy.  She again is noted to have an elevated blood sugar however unfortunately, has not sought care for this. Additionally, she was noted to have persistent low hemoglobin and her iron level was less than 10. She was prescribed iron however she did not pick it up   Review of Systems  Constitutional: Positive for fatigue. Negative for fever, appetite change and unexpected weight change.  HENT: Negative for sore throat and trouble swallowing.   Respiratory: Negative for cough and shortness of breath.   Cardiovascular: Negative for chest pain.  Gastrointestinal: Negative for nausea, abdominal pain and diarrhea.  Genitourinary:       Polyuria and polydipsia  Neurological: Negative for dizziness and headaches.       Objective:   Physical Exam  Constitutional: She appears well-developed and well-nourished. No distress.  HENT:  Mouth/Throat: Oropharynx is clear and moist. No oropharyngeal exudate.  Cardiovascular: Normal rate, regular rhythm and normal heart sounds.  Exam reveals no gallop and no friction rub.   No murmur heard. Pulmonary/Chest: Effort normal and breath sounds normal. No respiratory distress. She has no wheezes. She has no rales.  Abdominal: Soft. Bowel sounds are normal. She exhibits no distension. There is no tenderness. There is no rebound.           Assessment & Plan:

## 2012-04-14 ENCOUNTER — Encounter (INDEPENDENT_AMBULATORY_CARE_PROVIDER_SITE_OTHER): Payer: Self-pay | Admitting: General Surgery

## 2012-04-14 ENCOUNTER — Ambulatory Visit (INDEPENDENT_AMBULATORY_CARE_PROVIDER_SITE_OTHER): Payer: Commercial Indemnity | Admitting: General Surgery

## 2012-04-14 VITALS — BP 100/72 | HR 78 | Resp 16 | Ht 63.0 in | Wt 211.0 lb

## 2012-04-14 DIAGNOSIS — L732 Hidradenitis suppurativa: Secondary | ICD-10-CM

## 2012-04-14 MED ORDER — ONDANSETRON HCL 4 MG PO TABS: 4.0000 mg | ORAL_TABLET | Freq: Three times a day (TID) | ORAL | Status: DC | PRN

## 2012-04-14 MED ORDER — HYDROCODONE-ACETAMINOPHEN 5-325 MG PO TABS: 1.0000 | ORAL_TABLET | Freq: Four times a day (QID) | ORAL | Status: DC | PRN

## 2012-04-14 NOTE — Patient Instructions (Signed)
Take antibiotic as prescribed  Abscess Care After An abscess (also called a boil or furuncle) is an infected area that contains a collection of pus. Signs and symptoms of an abscess include pain, tenderness, redness, or hardness, or you may feel a moveable soft area under your skin. An abscess can occur anywhere in the body. The infection may spread to surrounding tissues causing cellulitis. A cut (incision) by the surgeon was made over your abscess and the pus was drained out. Gauze may have been packed into the space to provide a drain that will allow the cavity to heal from the inside outwards. The boil may be painful for 5 to 7 days. Most people with a boil do not have high fevers. Your abscess, if seen early, may not have localized, and may not have been lanced. If not, another appointment may be required for this if it does not get better on its own or with medications.  HOME CARE INSTRUCTIONS   Only take over-the-counter or prescription medicines for pain, discomfort, or fever as directed by your caregiver.   When you bathe in the morning, soak and then remove gauze and the iodoform packs. You may then wash the wound gently with mild soapy water. Cover with gauze and try to place the corner of a gauze between the skin edges to prevent it from closing prematurely and cover with gauze.   SEEK IMMEDIATE MEDICAL CARE IF:   You develop increased pain, swelling, redness, drainage, or bleeding in the wound site.   You develop signs of generalized infection including muscle aches, chills, fever, or a general ill feeling.   An oral temperature above 102 F (38.9 C) develops, not controlled by medication.  See your caregiver for a recheck if you develop any of the symptoms described above. If medications (antibiotics) were prescribed, take them as directed.  Hidradenitis Suppurativa, Sweat Gland Abscess Hidradenitis suppurativa is a long lasting (chronic), uncommon disease of the sweat glands.  With this, boil-like lumps and scarring develop in the groin, some times under the arms (axillae), and under the breasts. It may also uncommonly occur behind the ears, in the crease of the buttocks, and around the genitals.  CAUSES  The cause is from a blocking of the sweat glands. They then become infected. It may cause drainage and odor. It is not contagious. So it cannot be given to someone else. It most often shows up in puberty (about 26 to 34 years of age). But it may happen much later. It is similar to acne which is a disease of the sweat glands. This condition is slightly more common in African-Americans and women. SYMPTOMS   Hidradenitis usually starts as one or more red, tender, swellings in the groin or under the arms (axilla).  Over a period of hours to days the lesions get larger. They often open to the skin surface, draining clear to yellow-colored fluid.  The infected area heals with scarring. DIAGNOSIS  Your caregiver makes this diagnosis by looking at you. Sometimes cultures (growing germs on plates in the lab) may be taken. This is to see what germ (bacterium) is causing the infection.  TREATMENT   Topical germ killing medicine applied to the skin (antibiotics) are the treatment of choice. Antibiotics taken by mouth (systemic) are sometimes needed when the condition is getting worse or is severe.  Avoid tight-fitting clothing which traps moisture in.  Dirt does not cause hidradenitis and it is not caused by poor hygiene.  Involved areas  should be cleaned daily using an antibacterial soap. Some patients find that the liquid form of Lever 2000, applied to the involved areas as a lotion after bathing, can help reduce the odor related to this condition.  Sometimes surgery is needed to drain infected areas or remove scarred tissue. Removal of large amounts of tissue is used only in severe cases.  Birth control pills may be helpful.  Oral retinoids (vitamin A derivatives) for 6  to 12 months which are effective for acne may also help this condition.  Weight loss will improve but not cure hidradenitis. It is made worse by being overweight. But the condition is not caused by being overweight.  This condition is more common in people who have had acne.  It may become worse under stress. There is no medical cure for hidradenitis. It can be controlled, but not cured. The condition usually continues for years with periods of getting worse and getting better (remission). Document Released: 11/04/2003 Document Revised: 06/14/2011 Document Reviewed: 11/20/2007 [facility] Patient Information 2013 [place], [place].

## 2012-04-14 NOTE — Progress Notes (Signed)
Patient ID: Melissa Cruz, female   DOB: 07/21/1980, 32 y.o.   MRN: [id number]  Chief Complaint  Patient presents with  . Other    Eval bilateral axilla abscesses    HPI Melissa Cruz is a 32 y.o. female.   HPI 32yo female referred by Dr Luciana Axe for evaluation of hidradenitis. She has HIV which is under good control without a detectable viral load. She also has DM and has had persistently elevated blood sugars. She reports a history of bilateral axillary and groin abscesses in the past that have required lancing. She was placed on cleocin yesterday. She states that she started having pain and swelling in her right axilla on Wednesday. She denies any fevers, chills, night sweats or weight loss. She has already started taking her Cleocin. Past Medical History  Diagnosis Date  . Diabetes mellitus without complication   . HIV infection     No past surgical history on file.  No family history on file.  Social History History  Substance Use Topics  . Smoking status: Current Every Day Smoker -- 0.5 packs/day for 8 years    Types: Cigarettes  . Smokeless tobacco: Never Used  . Alcohol Use: No    Allergies  Allergen Reactions  . Shellfish Allergy     Current Outpatient Prescriptions  Medication Sig Dispense Refill  . clindamycin (CLEOCIN) 300 MG capsule Take 1 capsule (300 mg total) by mouth 3 (three) times daily.  42 capsule  0  . elvitegravir-cobicistat-emtricitabine-tenofovir (STRIBILD) 150-150-200-300 MG TABS Take 1 tablet by mouth daily with breakfast.  30 tablet  5  . ferrous sulfate 325 (65 FE) MG tablet Take 1 tablet (325 mg total) by mouth 2 (two) times daily.  60 tablet  3  . fluconazole (DIFLUCAN) 150 MG tablet Take 1 tablet (150 mg total) by mouth once.  1 tablet  2  . metFORMIN (GLUCOPHAGE) 500 MG tablet Take 1 tablet (500 mg total) by mouth daily with breakfast.  30 tablet  5    Review of Systems Review of Systems  Constitutional: Negative for fever, chills, activity  change and appetite change.  HENT: Negative for hearing loss and nosebleeds.   Eyes: Negative for photophobia.  Respiratory: Negative for chest tightness and shortness of breath.   Cardiovascular: Negative for chest pain.  Hematological: Negative for adenopathy. Does not bruise/bleed easily.  All other systems reviewed and are negative.    Blood pressure 100/72, pulse 78, resp. rate 16, height 5\' 3"  (1.6 m), weight 211 lb (95.709 kg), last menstrual period 04/06/2012.  Physical Exam Physical Exam  Vitals reviewed. Constitutional: She is oriented to person, place, and time. She appears well-developed and well-nourished. No distress.  HENT:  Head: Normocephalic and atraumatic.  Right Ear: External ear normal.  Left Ear: External ear normal.  Eyes: Conjunctivae normal are normal. No scleral icterus.  Pulmonary/Chest: Effort normal. No stridor. No respiratory distress.  Neurological: She is alert and oriented to person, place, and time.  Skin: Skin is warm. She is not diaphoretic.       Right axilla - large area of induration about 5 x 5cm. No erythema. +TTP. Firm. No LAD Left axilla - 1cm of induration. No masses  Psychiatric: She has a normal mood and affect. Her behavior is normal. Judgment and thought content normal.    Data Reviewed Dr Ephriam Knuckles note  Assessment    Infected Right Axillary Hidradenitis suppurativa HIV DM Tobacco use    Plan    We discussed hydradenitis.  The patient was given educational material. The 1 area in the right axilla appears inflamed given the tenderness and induration. I recommended incision and drainage for temporary relief. After obtaining verbal consent, the area was prepped with a Betadine. 10 cc of 2% Xylocaine with epinephrine was infiltrated over the maximum area of induration. Then using a 15 blade I made a 2 inch incision. Once I got through the deep dermis there is a large expression of purulent fluid. I then packed the wound with  quarter-inch iodoform gauze followed by a 4 x 4 gauze and tape.  The patient was given wound care instructions. She was encouraged to continue to take her Cleocin. I gave her a prescription for Vicodin and Zofran. Followup with me in 4 weeks. She was given instructions on what to call for.  Mary Sella. Andrey Campanile, MD, FACS General, Bariatric, & Minimally Invasive Surgery [facility], [place]        [facility] M 04/14/2012, 2:48 PM

## 2012-04-27 ENCOUNTER — Other Ambulatory Visit: Payer: Managed Care, Other (non HMO)

## 2012-04-27 DIAGNOSIS — E119 Type 2 diabetes mellitus without complications: Secondary | ICD-10-CM

## 2012-04-27 LAB — COMPLETE METABOLIC PANEL WITH GFR
Alkaline Phosphatase: 77 U/L (ref 39–117)
GFR, Est Non African American: >89 mL/min
Glucose, Bld: 155 mg/dL — ABNORMAL HIGH (ref 70–99)
Sodium: 133 mEq/L — ABNORMAL LOW (ref 135–145)
Total Bilirubin: 0.3 mg/dL (ref 0.3–1.2)
Total Protein: 7.7 g/dL (ref 6.0–8.3)

## 2012-05-01 ENCOUNTER — Other Ambulatory Visit: Payer: Self-pay | Admitting: *Deleted

## 2012-05-01 ENCOUNTER — Telehealth: Payer: Self-pay | Admitting: *Deleted

## 2012-05-01 DIAGNOSIS — B20 Human immunodeficiency virus [HIV] disease: Secondary | ICD-10-CM

## 2012-05-01 MED ORDER — ELVITEG-COBIC-EMTRICIT-TENOFDF 150-150-200-300 MG PO TABS: 1.0000 | ORAL_TABLET | Freq: Every day | ORAL | Status: DC

## 2012-05-01 NOTE — Telephone Encounter (Signed)
Patient called and advised she needs this refill to be 90 day supply for her insurance to pay. Or her copay is $270 which is to much. Advised her will call and change the order.

## 2012-05-16 ENCOUNTER — Ambulatory Visit (INDEPENDENT_AMBULATORY_CARE_PROVIDER_SITE_OTHER): Payer: Managed Care, Other (non HMO) | Admitting: Internal Medicine

## 2012-05-16 VITALS — BP 118/77 | HR 92 | Temp 98.0°F | Ht 63.0 in | Wt 215.0 lb

## 2012-05-16 DIAGNOSIS — E119 Type 2 diabetes mellitus without complications: Secondary | ICD-10-CM

## 2012-05-16 DIAGNOSIS — E1149 Type 2 diabetes mellitus with other diabetic neurological complication: Secondary | ICD-10-CM

## 2012-05-16 DIAGNOSIS — B20 Human immunodeficiency virus [HIV] disease: Secondary | ICD-10-CM

## 2012-05-16 MED ORDER — METFORMIN HCL 500 MG PO TABS: 500.0000 mg | ORAL_TABLET | Freq: Two times a day (BID) | ORAL | Status: DC

## 2012-05-16 MED ORDER — BLOOD GLUCOSE METER KIT: PACK | Status: DC

## 2012-05-16 MED ORDER — GLUCOSE BLOOD VI STRP: ORAL_STRIP | Status: DC

## 2012-05-16 NOTE — Progress Notes (Signed)
  Subjective:    Patient ID: Melissa Cruz, female    DOB: 1980/10/27, 32 y.o.   MRN: [id number]  HPI She comes in for followup of her diabetes. I saw her one month ago for her HIV followup and did start her on metformin for her persistent diabetes. She initially started once a day however then went to twice a day metformin and feels well. She does not check her blood sugars at home. She has had no problems with dizziness, lightheadedness or other effects. Her hemoglobin A1c is 7.8.   Review of Systems  Constitutional: Negative for appetite change and fatigue.  Gastrointestinal: Negative for nausea, abdominal pain and diarrhea.  Skin: Negative for rash.  Neurological: Negative for dizziness and headaches.       Objective:   Physical Exam  Constitutional: She appears well-developed and well-nourished. No distress.  Cardiovascular: Normal rate, regular rhythm and normal heart sounds.  Exam reveals no gallop and no friction rub.   No murmur heard. Pulmonary/Chest: Effort normal and breath sounds normal. No respiratory distress. She has no wheezes. She has no rales.          Assessment & Plan:

## 2012-05-16 NOTE — Assessment & Plan Note (Signed)
I think twice a day metformin is optimal for her and she will continue with this. I did prescribe her a glucometer and did tell her to check her blood sugars particularly in the a.m. Fasting. She will followup in 3 months for her HIV and diabetes and I will get a hemoglobin A1c at that time. She also will get an eye exam

## 2012-05-24 ENCOUNTER — Encounter (INDEPENDENT_AMBULATORY_CARE_PROVIDER_SITE_OTHER): Payer: Self-pay | Admitting: General Surgery

## 2012-05-24 ENCOUNTER — Ambulatory Visit (INDEPENDENT_AMBULATORY_CARE_PROVIDER_SITE_OTHER): Payer: Commercial Indemnity | Admitting: General Surgery

## 2012-05-24 VITALS — BP 110/68 | HR 70 | Resp 18 | Ht 63.0 in | Wt 216.0 lb

## 2012-05-24 DIAGNOSIS — Z09 Encounter for follow-up examination after completed treatment for conditions other than malignant neoplasm: Secondary | ICD-10-CM

## 2012-05-24 NOTE — Patient Instructions (Signed)
Call if problems develop

## 2012-05-24 NOTE — Progress Notes (Signed)
Subjective:     Patient ID: Melissa Cruz, female   DOB: Apr 18, 1980, 32 y.o.   MRN: [id number]  HPI 32 year old obese African American female with HIV, diabetes mellitus comes in for followup after undergoing incision and drainage of right infected axillary hydradenitis on January 10 the office. She states that she has been well. She states that she had probably about a week and a half to 2 weeks of intermittent drainage. Initially the drainage was purulent the then subsequently turned to a clear drainage. She denies any fevers or chills. She denies any residual pain or discomfort in her right axilla. She is working on her smoking as well as her diabetes.  Review of Systems     Objective:   Physical Exam BP 110/68  Pulse 70  Resp 18  Ht 5\' 3"  (1.6 m)  Wt 216 lb (97.977 kg)  BMI 38.27 kg/m2  LMP 05/02/2012 Alert, no apparent distress Right axilla-well-healed incision. No cellulitis, induration or fluctuance. There is some chronic thickening in scattered areas. No active sinuses    Assessment:     Status post incision and drainage of infected right axillary hydradenitis     Plan:     Overall I think she is doing well. I explained that she is at risk for recurrence of active disease. However I recommended against proceeding with surgical excision of the area for now since he has ongoing tobacco use and uncontrolled diabetes. Her last hemoglobin A1c was greater than 7. And her metformin dose was recently doubled. Once she is able to refrain from smoking and has her blood sugar under better control I think she may be a suitable candidate for excision of the area. Followup when necessary  Mary Sella. Andrey Campanile, MD, FACS General, Bariatric, & Minimally Invasive Surgery [facility], [place]

## 2012-06-30 ENCOUNTER — Encounter: Payer: Self-pay | Admitting: *Deleted

## 2012-08-02 ENCOUNTER — Other Ambulatory Visit: Payer: Managed Care, Other (non HMO)

## 2012-08-17 ENCOUNTER — Other Ambulatory Visit (INDEPENDENT_AMBULATORY_CARE_PROVIDER_SITE_OTHER): Payer: Managed Care, Other (non HMO)

## 2012-08-17 ENCOUNTER — Ambulatory Visit: Payer: Managed Care, Other (non HMO) | Admitting: Internal Medicine

## 2012-08-17 ENCOUNTER — Other Ambulatory Visit: Payer: Self-pay | Admitting: Internal Medicine

## 2012-08-17 DIAGNOSIS — E1149 Type 2 diabetes mellitus with other diabetic neurological complication: Secondary | ICD-10-CM

## 2012-08-17 DIAGNOSIS — B20 Human immunodeficiency virus [HIV] disease: Secondary | ICD-10-CM

## 2012-08-17 LAB — CBC WITH DIFFERENTIAL/PLATELET
Basophils Relative: 0 % (ref 0–1)
HCT: 37 % (ref 36.0–46.0)
Hemoglobin: 12.8 g/dL (ref 12.0–15.0)
MCH: 27.8 pg (ref 26.0–34.0)
MCHC: 34.6 g/dL (ref 30.0–36.0)
Monocytes Absolute: 0.6 10*3/uL (ref 0.1–1.0)
Monocytes Relative: 8 % (ref 3–12)
Neutro Abs: 4.5 10*3/uL (ref 1.7–7.7)

## 2012-08-17 LAB — COMPLETE METABOLIC PANEL WITH GFR
ALT: 11 U/L (ref 0–35)
Alkaline Phosphatase: 92 U/L (ref 39–117)
Sodium: 134 mEq/L — ABNORMAL LOW (ref 135–145)
Total Bilirubin: 0.2 mg/dL — ABNORMAL LOW (ref 0.3–1.2)
Total Protein: 7.4 g/dL (ref 6.0–8.3)

## 2012-08-17 LAB — HEMOGLOBIN A1C: Mean Plasma Glucose: 212 mg/dL — ABNORMAL HIGH (ref <117)

## 2012-08-18 LAB — HIV-1 RNA QUANT-NO REFLEX-BLD: HIV-1 RNA Quant, Log: <1.3 {Log} (ref <1.30)

## 2012-08-18 LAB — T-HELPER CELL (CD4) - (RCID CLINIC ONLY): CD4 % Helper T Cell: 16 % — ABNORMAL LOW (ref 33–55)

## 2012-09-07 ENCOUNTER — Telehealth: Payer: Self-pay | Admitting: *Deleted

## 2012-09-07 ENCOUNTER — Ambulatory Visit: Payer: Managed Care, Other (non HMO) | Admitting: Internal Medicine

## 2012-09-07 NOTE — Telephone Encounter (Signed)
Called patient to notify her of her missed appointment.  Pt apologized, stated she overslept on her last day before returning to work [employer]ar[employer].  RN explained the no-show policy, advised her this would be the last scheduled appointment.  Rescheduled for 09/12/12 at 9:45 to accommodate her work schedule. Andree Coss, RN

## 2012-09-07 NOTE — Telephone Encounter (Signed)
FYI - Below

## 2012-09-12 ENCOUNTER — Encounter: Payer: Self-pay | Admitting: Internal Medicine

## 2012-09-12 ENCOUNTER — Ambulatory Visit (INDEPENDENT_AMBULATORY_CARE_PROVIDER_SITE_OTHER): Payer: Managed Care, Other (non HMO) | Admitting: Internal Medicine

## 2012-09-12 VITALS — BP 116/78 | HR 96 | Temp 98.5°F | Ht 63.0 in | Wt 216.0 lb

## 2012-09-12 DIAGNOSIS — B20 Human immunodeficiency virus [HIV] disease: Secondary | ICD-10-CM

## 2012-09-12 DIAGNOSIS — E119 Type 2 diabetes mellitus without complications: Secondary | ICD-10-CM

## 2012-09-12 MED ORDER — METFORMIN HCL ER 500 MG PO TB24: 500.0000 mg | ORAL_TABLET | Freq: Two times a day (BID) | ORAL | Status: DC

## 2012-09-12 MED ORDER — METFORMIN HCL ER 500 MG PO TB24: 500.0000 mg | ORAL_TABLET | Freq: Every day | ORAL | Status: DC

## 2012-09-12 NOTE — Assessment & Plan Note (Signed)
She is doing well with her HIV and will continue with her current regimen. Repeat labs in 4-6 months per

## 2012-09-12 NOTE — Assessment & Plan Note (Addendum)
She has poorly controlled diabetes.  This has mainly been due to medication noncompliance. I did discuss with her at length the need to take her medication and she has she has restarted it. Her labs were not done fasting so unclear what her fasting blood sugar is. To help alleviate the side effects, I have prescribed a long acting form. I did discuss diet and exercise. She is going to be scheduled for the nutritionist and I am went to have her see a primary doctor to help better manage her diabetes. She will need an eye exam I will defer to her primary physician unless there is a delay. She will come back in 4 weeks with a fasting blood sugar and if significantly elevated, we'll consider increasing her metformin.

## 2012-09-12 NOTE — Progress Notes (Signed)
HPI: Melissa Cruz is a 32 y.o. female with HIV under good control on Stribild. She was also recently diagnosed with DM. Patient has not been taking her metformin since her last visit.  Allergies: Allergies  Allergen Reactions  . Shellfish Allergy     Vitals: Temp: 98.5 F (36.9 C) (06/10 0933) Temp src: Oral (06/10 0933) BP: 116/78 mmHg (06/10 0933) Pulse Rate: 96 (06/10 0933)  Past Medical History: Past Medical History  Diagnosis Date  . Diabetes mellitus without complication   . HIV infection     Social History: History   Social History  . Marital Status: Married    Spouse Name: N/A    Number of Children: N/A  . Years of Education: N/A   Social History Main Topics  . Smoking status: Current Every Day Smoker -- 0.50 packs/day for 8 years    Types: Cigarettes  . Smokeless tobacco: Never Used  . Alcohol Use: No  . Drug Use: No  . Sexually Active: Yes -- Female partner(s)     Comment: pt. declined condoms   Other Topics Concern  . None   Social History Narrative  . None    Current Regimen: Stribild  Labs: HIV 1 RNA Quant (copies/mL)  Date Value  08/17/2012 <20   04/03/2012 <20   02/15/2012 16109*     CD4 T Cell Abs (cmm)  Date Value  08/17/2012 400   04/03/2012 270*  02/15/2012 200*     Hep B S Ab (no units)  Date Value  02/15/2012 NEG      Hepatitis B Surface Ag (no units)  Date Value  02/15/2012 NEGATIVE      HCV Ab (no units)  Date Value  02/15/2012 NEGATIVE     CrCl: Estimated Creatinine Clearance: 111.5 ml/min (by C-G formula based on Cr of 0.59).  Lipids:    Component Value Date/Time   CHOL 143 02/15/2012 1128   TRIG 135 02/15/2012 1128   HDL 35* 02/15/2012 1128   CHOLHDL 4.1 02/15/2012 1128   VLDL 27 02/15/2012 1128   LDLCALC 81 02/15/2012 1128    Assessment: Spoke with patient about DM in general and her barriers to taking metformin. She had been experiencing diarrhea with metformin. She states that it was much better  with meals but her eating habits are unpredictable and taking it twice a day with meals is difficult for her. Counseled her on the diarrhea likely getting better after 2 weeks of consistently taking it. Also recommended changing to the XR form due to decreased side effects and less strict dosing. Still recommend taking twice a day with food to help avoid GI disturbance but doses don't have to be spread as far apart since it lasts 24hrs.  We also discussed other treatment options for DM. Victoza or any other GLP-1 would be my next recommendation since similar to metformin it does not cause hypoglycemia and has the added benefit of helping with weightloss. Patient is not afraid of injectable therapy so only barrier would insurance coverage in this case  Recommendations: - Refer to PCP and diabetes educator - Change metformin to XR 500mg  twice daily with meals, if she tolerates would increase to 1000mg  twice daily - If metformin intolerant or not controlled on max dose would consider Victoza - Continue Stribild  Drue Stager, PharmD [facility] 09/12/2012, 10:37 AM

## 2012-09-12 NOTE — Progress Notes (Signed)
  Subjective:    Patient ID: Melissa Cruz, female    DOB: 1980/06/08, 32 y.o.   MRN: [id number]  HPI She comes in here for followup of her diabetes and HIV. She is on Stribild for her HIV and has good tolerance and excellent compliance with no missed doses. Her CD4 count is now 400 with an undetectable viral load still. She feels well and is pleased with the regimen. She has had no rash, diarrhea or other side effects including no nausea with the medication.  For her diabetes, she was started on metformin twice a day however due to some intolerance with the medication and GI upset, she stopped until yesterday when she started taking it with food and noted improvement in the side effect profile. Her A1c is 9 which is much worse than before. She also has gained several pounds. No signs of peripheral neuropathy, vision changes.   Review of Systems  Constitutional: Negative for fever, appetite change, fatigue and unexpected weight change.  HENT: Negative for sore throat and trouble swallowing.   Eyes: Negative for visual disturbance.  Respiratory: Negative for cough and shortness of breath.   Cardiovascular: Negative for leg swelling.  Gastrointestinal: Positive for diarrhea. Negative for nausea, abdominal pain and abdominal distention.  Musculoskeletal: Negative for myalgias and arthralgias.  Skin: Negative for rash.  Neurological: Negative for dizziness, light-headedness and headaches.  Hematological: Negative for adenopathy.       Objective:   Physical Exam  Constitutional: She appears well-developed and well-nourished. No distress.  HENT:  Mouth/Throat: No oropharyngeal exudate.  Eyes: No scleral icterus.  Cardiovascular: Normal rate, regular rhythm and normal heart sounds.  Exam reveals no gallop and no friction rub.   No murmur heard. Pulmonary/Chest: Effort normal and breath sounds normal. No respiratory distress. She has no wheezes.  Abdominal: Soft. Bowel sounds are normal. She  exhibits no distension. There is no tenderness.  Lymphadenopathy:    She has no cervical adenopathy.  Skin: No rash noted.          Assessment & Plan:

## 2012-09-13 ENCOUNTER — Other Ambulatory Visit: Payer: Self-pay | Admitting: Internal Medicine

## 2012-09-13 DIAGNOSIS — E119 Type 2 diabetes mellitus without complications: Secondary | ICD-10-CM

## 2012-09-15 ENCOUNTER — Other Ambulatory Visit: Payer: Self-pay | Admitting: Licensed Clinical Social Worker

## 2012-09-15 ENCOUNTER — Other Ambulatory Visit: Payer: Self-pay | Admitting: *Deleted

## 2012-09-15 DIAGNOSIS — B20 Human immunodeficiency virus [HIV] disease: Secondary | ICD-10-CM

## 2012-09-15 DIAGNOSIS — E1165 Type 2 diabetes mellitus with hyperglycemia: Secondary | ICD-10-CM

## 2012-09-15 MED ORDER — FLUCONAZOLE 150 MG PO TABS: 150.0000 mg | ORAL_TABLET | Freq: Once | ORAL | Status: DC

## 2012-09-15 MED ORDER — METFORMIN HCL ER 500 MG PO TB24: 500.0000 mg | ORAL_TABLET | Freq: Two times a day (BID) | ORAL | Status: DC

## 2012-09-15 MED ORDER — ELVITEG-COBIC-EMTRICIT-TENOFDF 150-150-200-300 MG PO TABS: 1.0000 | ORAL_TABLET | Freq: Every day | ORAL | Status: DC

## 2012-10-02 ENCOUNTER — Telehealth: Payer: Self-pay | Admitting: *Deleted

## 2012-10-02 NOTE — Telephone Encounter (Signed)
Requested patient call [facility] for PAP smear appt.  Reminded pt of appts at [facility] this Thursday to start PCP care for diabetes.

## 2012-10-05 ENCOUNTER — Encounter: Payer: Self-pay | Admitting: Internal Medicine

## 2012-10-05 ENCOUNTER — Ambulatory Visit (INDEPENDENT_AMBULATORY_CARE_PROVIDER_SITE_OTHER): Payer: Managed Care, Other (non HMO) | Admitting: Dietician

## 2012-10-05 ENCOUNTER — Ambulatory Visit (INDEPENDENT_AMBULATORY_CARE_PROVIDER_SITE_OTHER): Payer: Managed Care, Other (non HMO) | Admitting: Internal Medicine

## 2012-10-05 ENCOUNTER — Encounter: Payer: Self-pay | Admitting: Dietician

## 2012-10-05 VITALS — BP 109/72 | HR 103 | Temp 97.6°F | Ht 63.5 in | Wt 215.9 lb

## 2012-10-05 DIAGNOSIS — E1165 Type 2 diabetes mellitus with hyperglycemia: Secondary | ICD-10-CM

## 2012-10-05 DIAGNOSIS — F17200 Nicotine dependence, unspecified, uncomplicated: Secondary | ICD-10-CM

## 2012-10-05 DIAGNOSIS — L732 Hidradenitis suppurativa: Secondary | ICD-10-CM

## 2012-10-05 DIAGNOSIS — E118 Type 2 diabetes mellitus with unspecified complications: Secondary | ICD-10-CM

## 2012-10-05 LAB — GLUCOSE, CAPILLARY: Glucose-Capillary: 170 mg/dL — ABNORMAL HIGH (ref 70–99)

## 2012-10-05 MED ORDER — METFORMIN HCL ER 500 MG PO TB24: 1500.0000 mg | ORAL_TABLET | Freq: Every day | ORAL | Status: DC

## 2012-10-05 NOTE — Assessment & Plan Note (Signed)
Lab Results  Component Value Date   HGBA1C 9.0* 08/17/2012   HGBA1C 7.8* 04/27/2012     Assessment: Diabetes control: poor control (HgbA1C >9%) Progress toward A1C goal: no change Comments: The patient is motivated to take control of her DM treatment and plans to be compliant with medical management.  Plan: Medications:  Plan to increase metformin ER to 1,500 mg daily. Home glucose monitoring: Frequency: once a day Timing: before breakfast Instruction/counseling given: reminded to get eye exam, reminded to bring blood glucose meter & log to each visit, reminded to bring medications to each visit, discussed foot care and discussed diet Educational resources provided: brochure Self management tools provided: home glucose logbook Other plans: Referred to Lupita Leash for diabetes counseling and education. Plan to follow-up with me in August for [facility] recheck.

## 2012-10-05 NOTE — Addendum Note (Signed)
Addended by: Maura Crandall on: 10/05/2012 11:16 AM   Modules accepted: Orders

## 2012-10-05 NOTE — Assessment & Plan Note (Signed)
Assessment:  Patient has no signs of active disease.  Plan:  Follow-up as needed.

## 2012-10-05 NOTE — Progress Notes (Signed)
I saw and evaluated the patient.  I personally confirmed the key portions of the history and exam documented by Dr. Komanski and I reviewed pertinent patient test results.  The assessment, diagnosis, and plan were formulated together and I agree with the documentation in the resident's note.  

## 2012-10-05 NOTE — Patient Instructions (Addendum)
Increase metformin ER to 1500 daily  Follow-up with Dr. Glendell Docker in mid august.

## 2012-10-05 NOTE — Assessment & Plan Note (Signed)
  Assessment: Progress toward smoking cessation:  smoking less Barriers to progress toward smoking cessation:  Stress, and anxiety about not being able to have a cig. Comments: The patient is motivated to quit smoking by the October. She notes that her mother recently quit smoking which is a great motivation for her. Further, she just got a new car which she does not want to smoke inside.  Plan: Instruction/counseling given:  I counseled patient on the dangers of tobacco use, advised patient to stop smoking, and reviewed strategies to maximize success. Educational resources provided:  [employer]line[employer]) brochure Self management tools provided:  smoking cessation plan (STAR Quit Plan) Medications to assist with smoking cessation:  None Patient agreed to the following self-care plans for smoking cessation: go to the [employer] (www.quitlinenc.com);cut down the number of cigarettes smoked  Other plans: Follow up with patient at next visit.

## 2012-10-05 NOTE — Progress Notes (Signed)
Initial visit:  MNT1  Medical Nutrition Therapy:  Appt start time: 0930 end time:  1030.  MEDICATIONS extended release metformin 1000mg /day increasing now to 1500mg /day  Assessment:  Primary concerns today: Meal planning.   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 She shops, husband helps with meal preparation.  Usual eating pattern includes 3 meals and 3 snacks per day. Everyday foods include fruits and nuts now instead of processed snack foods.   Checks blood sugar 1 time a day in am which is likely non fasting most days, reports that it has been running 130-145 mg/dl. She is active with her 3 children and is thinking about joining a [employer]. To work out 3 times a week. Motivated to make healthy lifestyle changes.  BMI @ 37 indicates class 3 obesity. Labs noted with A1C 9%. Recommend 7% weight loss to goal weight of ~200#  Progress Towards Goal(s):  In progress.   Nutritional Diagnosis:  NB-1.1 Food and nutrition-related knowledge deficit As related to lack of prior exposure to diabetes education & meal planning.  As evidenced by her report and questions.    Intervention:  Nutrition education about diabetes disease, medications, meal planning and exercise.  Monitoring/Evaluation:  Dietary intake, exercise, blood sugars, and body weight in 4 week(s).

## 2012-10-05 NOTE — Patient Instructions (Addendum)
Please try to eat about 45-60 grams  carb at each meal and 15-20 for snacks   Obtain twice as many veg's as protein or carbohydrate foods for both lunch and dinner.   Veggies and meats do not count- add them as needed.   Look for exercise opportunities in your day:  Never lie down when you can sit; never sit when you can stand; never stand when you can pace.  Moving your body throughout the day is just as important as the 30 or 60 minutes of exercise at the gym!  Please make a follow up appointment for 3-4 weeks

## 2012-10-05 NOTE — Progress Notes (Signed)
Subjective:   Patient ID: Melissa Cruz female   DOB: December 07, 1980 32 y.o.   MRN: [id number]  HPI: Melissa Cruz is a 32 y.o. with a history of HIV who comes to clinic today to be seen to establish care for her recently diagnoses DM II. The patient has been seen by Dr. Luciana Axe for treatment for HIV. In december, she was diagnosed with DM. She has been non-compliant with metformin secondary to GI distress. Furthermore, she has difficulty maintaining a regular pattern for taking her medicines as she works third shift as a Engineer, civil (consulting). During this time, her HgA1C has increased to 9.0. For the last two weeks, the patient has been compliant taking 1000 mg metformin ER daily. The patient expresses interest in achieving control of her diabetes.     Past Medical History  Diagnosis Date  . Diabetes mellitus without complication   . HIV infection    Current Outpatient Prescriptions  Medication Sig Dispense Refill  . Blood Glucose Monitoring Suppl (BLOOD GLUCOSE METER) kit Use as instructed  1 each  0  . elvitegravir-cobicistat-emtricitabine-tenofovir (STRIBILD) 150-150-200-300 MG TABS Take 1 tablet by mouth daily with breakfast.  90 tablet  3  . glucose blood test strip Use as instructed  100 each  12  . metFORMIN (GLUCOPHAGE-XR) 500 MG 24 hr tablet Take 3 tablets (1,500 mg total) by mouth daily with breakfast.  60 tablet  5  . ferrous sulfate 325 (65 FE) MG tablet Take 1 tablet (325 mg total) by mouth 2 (two) times daily.  60 tablet  3  . fluconazole (DIFLUCAN) 150 MG tablet Take 1 tablet (150 mg total) by mouth once.  1 tablet  5   No current facility-administered medications for this visit.   Family History  Problem Relation Age of Onset  . Diabetes Maternal Uncle   . Cancer Maternal Grandmother   . Cancer Maternal Grandfather    History   Social History  . Marital Status: Married    Spouse Name: N/A    Number of Children: N/A  . Years of Education: N/A   Social History Main Topics  .  Smoking status: Current Every Day Smoker -- 0.30 packs/day for 8 years    Types: Cigarettes  . Smokeless tobacco: Never Used  . Alcohol Use: No  . Drug Use: No  . Sexually Active: Yes -- Female partner(s)    Birth Control/ Protection: Condom     Comment: pt. declined condoms   Other Topics Concern  . None   Social History Narrative  . None   Review of Systems: Review of Systems  Constitutional: Negative for fever, chills, weight loss, malaise/fatigue and diaphoresis.  HENT: Negative.  Negative for neck pain.   Eyes: Negative.   Respiratory: Negative.   Cardiovascular: Negative.   Gastrointestinal: Positive for nausea and diarrhea.  Genitourinary: Negative.   Musculoskeletal: Negative.  Negative for myalgias and back pain.  Skin: Negative for itching and rash.  Neurological: Positive for sensory change. Negative for dizziness, tingling, tremors and weakness.     Objective:    Filed Vitals:   10/05/12 0834  BP: 109/72  Pulse: 103  Temp: 97.6 F (36.4 C)  TempSrc: Oral  Height: 5' 3.5" (1.613 m)  Weight: 215 lb 14.4 oz (97.932 kg)  SpO2: 100%   Physical Exam: Physical Exam  Constitutional: She is oriented to person, place, and time. She appears well-developed and well-nourished. No distress.  HENT:  Head: Normocephalic and atraumatic.  Nose: Nose normal.  Eyes: Conjunctivae and EOM are normal. Pupils are equal, round, and reactive to light. No scleral icterus.  Neck: Normal range of motion.  Cardiovascular: Normal rate, regular rhythm, normal heart sounds and intact distal pulses.  Exam reveals no gallop and no friction rub.   No murmur heard. Pulmonary/Chest: Effort normal and breath sounds normal. No respiratory distress. She has no wheezes. She has no rales. She exhibits no tenderness.  Abdominal: Soft. Bowel sounds are normal. She exhibits no distension. There is no tenderness.  Musculoskeletal: Normal range of motion.  Neurological: She is alert and oriented  to person, place, and time. She has normal strength. No cranial nerve deficit.  Patient notes mild paresthesia in medial aspect of right arm surrounding her surgical site. This has been stable since her operation in February 2014.  Skin: Skin is warm and intact. No abrasion, no lesion and no rash noted. She is not diaphoretic. No erythema. No pallor.  Noted post-operative surgical changes in right axilla. No erythema or fluctuance.  Psychiatric: She has a normal mood and affect. Her behavior is normal. Judgment and thought content normal.    Assessment & Plan:

## 2012-10-06 LAB — URINALYSIS, ROUTINE W REFLEX MICROSCOPIC
Bilirubin Urine: NEGATIVE
Leukocytes, UA: NEGATIVE
Nitrite: NEGATIVE
Protein, ur: NEGATIVE mg/dL
Specific Gravity, Urine: 1.022 (ref 1.005–1.030)
Urobilinogen, UA: 0.2 mg/dL (ref 0.0–1.0)

## 2012-10-06 LAB — MICROALBUMIN / CREATININE URINE RATIO
Creatinine, Urine: 189.1 mg/dL
Microalb Creat Ratio: 2.6 mg/g (ref 0.0–30.0)

## 2012-10-12 ENCOUNTER — Other Ambulatory Visit: Payer: Self-pay

## 2013-01-04 ENCOUNTER — Encounter (INDEPENDENT_AMBULATORY_CARE_PROVIDER_SITE_OTHER): Payer: Self-pay | Admitting: General Surgery

## 2013-01-04 ENCOUNTER — Ambulatory Visit (INDEPENDENT_AMBULATORY_CARE_PROVIDER_SITE_OTHER): Payer: Commercial Indemnity | Admitting: General Surgery

## 2013-01-04 VITALS — BP 114/66 | HR 92 | Temp 98.8°F | Resp 15 | Ht 64.0 in | Wt 211.4 lb

## 2013-01-04 DIAGNOSIS — L732 Hidradenitis suppurativa: Secondary | ICD-10-CM

## 2013-01-04 DIAGNOSIS — L02419 Cutaneous abscess of limb, unspecified: Secondary | ICD-10-CM

## 2013-01-04 DIAGNOSIS — IMO0002 Reserved for concepts with insufficient information to code with codable children: Secondary | ICD-10-CM

## 2013-01-04 MED ORDER — DOXYCYCLINE HYCLATE 50 MG PO CAPS: 100.0000 mg | ORAL_CAPSULE | Freq: Two times a day (BID) | ORAL | Status: DC

## 2013-01-04 MED ORDER — HYDROCODONE-ACETAMINOPHEN 10-325 MG PO TABS: 1.0000 | ORAL_TABLET | Freq: Every day | ORAL | Status: DC

## 2013-01-04 MED ORDER — ONDANSETRON HCL 4 MG PO TABS: 4.0000 mg | ORAL_TABLET | Freq: Three times a day (TID) | ORAL | Status: DC | PRN

## 2013-01-04 NOTE — Addendum Note (Signed)
Addended by: Frederik Schmidt on: 01/04/2013 03:36 PM   Modules accepted: Orders

## 2013-01-04 NOTE — Progress Notes (Signed)
The patient comes in with a known history of recurrent hidradenitis. This current episode started several days ago and has gotten increasingly more discomforting. On examination she has a fluctuant mass in the right axilla. With a Betadine prep and under local anesthesia was able to make a 3 cm incision using a #11 blade to get into a large pocket of purulent material. It was a new cold purulent yellowish fluid which was cultured. He was subsequently cleaned out and packed with approximately 2 feet of quarter-inch iodoform Nu Gauze. The patient will return to our urgent office on Monday for packing removal. I will prescribe for her doxycycline 100 mg by mouth twice a day and Vicodin tablets to take for pain control.

## 2013-01-05 ENCOUNTER — Other Ambulatory Visit (INDEPENDENT_AMBULATORY_CARE_PROVIDER_SITE_OTHER): Payer: Self-pay | Admitting: General Surgery

## 2013-01-08 ENCOUNTER — Ambulatory Visit (INDEPENDENT_AMBULATORY_CARE_PROVIDER_SITE_OTHER): Payer: Commercial Indemnity | Admitting: Surgery

## 2013-01-08 ENCOUNTER — Encounter (INDEPENDENT_AMBULATORY_CARE_PROVIDER_SITE_OTHER): Payer: Self-pay | Admitting: Surgery

## 2013-01-08 VITALS — BP 128/76 | HR 92 | Temp 99.2°F | Resp 14 | Ht 64.0 in | Wt 218.8 lb

## 2013-01-08 DIAGNOSIS — Z09 Encounter for follow-up examination after completed treatment for conditions other than malignant neoplasm: Secondary | ICD-10-CM

## 2013-01-08 NOTE — Progress Notes (Signed)
Subjective:     Patient ID: Melissa Cruz, female   DOB: 06-05-1980, 32 y.o.   MRN: [id number]  HPI She is here for a followup visit status post incision and drainage of a right axillary abscess last week. She has less discomfort.  Review of Systems     Objective:   Physical Exam I removed the packing the wound is clean. I had to treat the edge that was bleeding with silver nitrate.    Assessment:     Right axillary abscess with history of hidradenitis     Plan:     I will see her back in one week. She will wash the wound daily with soap and water. We will discuss formal removal this area in the future. She will continue her antibiotics

## 2013-01-09 LAB — WOUND CULTURE

## 2013-01-16 ENCOUNTER — Ambulatory Visit (INDEPENDENT_AMBULATORY_CARE_PROVIDER_SITE_OTHER): Payer: Commercial Indemnity | Admitting: Surgery

## 2013-01-16 ENCOUNTER — Encounter (INDEPENDENT_AMBULATORY_CARE_PROVIDER_SITE_OTHER): Payer: Self-pay | Admitting: Surgery

## 2013-01-16 VITALS — BP 118/68 | HR 64 | Temp 97.4°F | Resp 14 | Ht 63.0 in | Wt 209.6 lb

## 2013-01-16 DIAGNOSIS — L732 Hidradenitis suppurativa: Secondary | ICD-10-CM

## 2013-01-16 NOTE — Progress Notes (Signed)
Subjective:     Patient ID: Melissa Cruz, female   DOB: 1981-03-13, 32 y.o.   MRN: [id number]  HPI She is here for a followup visit. She reports no drainage and much less discomfort in the right axilla. She has finished her course of antibiotics  Review of Systems     Objective:   Physical Exam The wound and the axilla has healed. There are still chronic changes consistent with hidradenitis    Assessment:     Right axilla hidradenitis     Plan:     Probably will go ahead and schedule her for wide excision of this area. I discussed the risks with her in detail to her multiple comorbidities. We will proceed with surgery

## 2013-02-03 DIAGNOSIS — L732 Hidradenitis suppurativa: Secondary | ICD-10-CM

## 2013-02-03 HISTORY — DX: Hidradenitis suppurativa: L73.2

## 2013-02-08 ENCOUNTER — Encounter (HOSPITAL_BASED_OUTPATIENT_CLINIC_OR_DEPARTMENT_OTHER): Payer: Self-pay | Admitting: *Deleted

## 2013-02-08 NOTE — Pre-Procedure Instructions (Signed)
To come for BMET and EKG 

## 2013-02-13 ENCOUNTER — Encounter (HOSPITAL_BASED_OUTPATIENT_CLINIC_OR_DEPARTMENT_OTHER)
Admission: RE | Admit: 2013-02-13 | Discharge: 2013-02-13 | Disposition: A | Discharge status: Home / Self Care | Payer: Managed Care, Other (non HMO) | Source: Ambulatory Visit | Attending: Surgery | Admitting: Surgery

## 2013-02-13 ENCOUNTER — Other Ambulatory Visit: Payer: Self-pay

## 2013-02-13 LAB — BASIC METABOLIC PANEL
BUN: 6 mg/dL (ref 6–23)
Calcium: 9.2 mg/dL (ref 8.4–10.5)
Creatinine, Ser: 0.52 mg/dL (ref 0.50–1.10)
GFR calc Af Amer: >90 mL/min (ref >90)
GFR calc non Af Amer: >90 mL/min (ref >90)
Glucose, Bld: 181 mg/dL — ABNORMAL HIGH (ref 70–99)
Potassium: 4.4 mEq/L (ref 3.5–5.1)

## 2013-02-14 ENCOUNTER — Ambulatory Visit (HOSPITAL_BASED_OUTPATIENT_CLINIC_OR_DEPARTMENT_OTHER)
Admission: RE | Admit: 2013-02-14 | Discharge: 2013-02-14 | Disposition: A | Discharge status: Home / Self Care | Payer: Managed Care, Other (non HMO) | Source: Ambulatory Visit | Attending: Surgery | Admitting: Surgery

## 2013-02-14 ENCOUNTER — Encounter (HOSPITAL_BASED_OUTPATIENT_CLINIC_OR_DEPARTMENT_OTHER): Payer: Self-pay | Admitting: Certified Registered Nurse Anesthetist

## 2013-02-14 ENCOUNTER — Encounter (HOSPITAL_BASED_OUTPATIENT_CLINIC_OR_DEPARTMENT_OTHER): Payer: Managed Care, Other (non HMO) | Admitting: Certified Registered Nurse Anesthetist

## 2013-02-14 ENCOUNTER — Encounter (HOSPITAL_BASED_OUTPATIENT_CLINIC_OR_DEPARTMENT_OTHER)
Admission: RE | Disposition: A | Discharge status: Home / Self Care | Payer: Self-pay | Source: Ambulatory Visit | Attending: Surgery

## 2013-02-14 ENCOUNTER — Ambulatory Visit (HOSPITAL_BASED_OUTPATIENT_CLINIC_OR_DEPARTMENT_OTHER): Payer: Managed Care, Other (non HMO) | Admitting: Certified Registered Nurse Anesthetist

## 2013-02-14 DIAGNOSIS — E119 Type 2 diabetes mellitus without complications: Secondary | ICD-10-CM | POA: Insufficient documentation

## 2013-02-14 DIAGNOSIS — Z91013 Allergy to seafood: Secondary | ICD-10-CM | POA: Insufficient documentation

## 2013-02-14 DIAGNOSIS — D649 Anemia, unspecified: Secondary | ICD-10-CM | POA: Insufficient documentation

## 2013-02-14 DIAGNOSIS — F17200 Nicotine dependence, unspecified, uncomplicated: Secondary | ICD-10-CM | POA: Insufficient documentation

## 2013-02-14 DIAGNOSIS — B20 Human immunodeficiency virus [HIV] disease: Secondary | ICD-10-CM | POA: Insufficient documentation

## 2013-02-14 DIAGNOSIS — L732 Hidradenitis suppurativa: Secondary | ICD-10-CM

## 2013-02-14 HISTORY — PX: HYDRADENITIS EXCISION: SHX5243

## 2013-02-14 HISTORY — DX: Personal history of diseases of the blood and blood-forming organs and certain disorders involving the immune mechanism: Z86.2

## 2013-02-14 HISTORY — DX: Asymptomatic human immunodeficiency virus (hiv) infection status: Z21

## 2013-02-14 HISTORY — DX: Hidradenitis suppurativa: L73.2

## 2013-02-14 HISTORY — DX: Type 2 diabetes mellitus without complications: E11.9

## 2013-02-14 LAB — GLUCOSE, CAPILLARY: Glucose-Capillary: 220 mg/dL — ABNORMAL HIGH (ref 70–99)

## 2013-02-14 SURGERY — EXCISION, HIDRADENITIS, AXILLA
Anesthesia: General | Site: Axilla | Laterality: Right | Wound class: Clean Contaminated

## 2013-02-14 MED ORDER — MIDAZOLAM HCL 2 MG/2ML IJ SOLN
INTRAMUSCULAR | Status: AC
  Filled 2013-02-14: qty 2

## 2013-02-14 MED ORDER — DOXYCYCLINE HYCLATE 100 MG PO TABS: 100.0000 mg | ORAL_TABLET | Freq: Two times a day (BID) | ORAL | Status: DC

## 2013-02-14 MED ORDER — ONDANSETRON HCL 4 MG/2ML IJ SOLN: 4.0000 mg | Freq: Four times a day (QID) | INTRAMUSCULAR | Status: DC | PRN

## 2013-02-14 MED ORDER — FENTANYL CITRATE 0.05 MG/ML IJ SOLN
INTRAMUSCULAR | Status: AC
  Filled 2013-02-14: qty 8

## 2013-02-14 MED ORDER — ONDANSETRON HCL 4 MG/2ML IJ SOLN
INTRAMUSCULAR | Status: DC | PRN
  Administered 2013-02-14: 4 mg via INTRAVENOUS

## 2013-02-14 MED ORDER — LIDOCAINE HCL (CARDIAC) 20 MG/ML IV SOLN
INTRAVENOUS | Status: DC | PRN
  Administered 2013-02-14: 60 mg via INTRAVENOUS

## 2013-02-14 MED ORDER — FENTANYL CITRATE 0.05 MG/ML IJ SOLN
INTRAMUSCULAR | Status: DC | PRN
  Administered 2013-02-14 (×2): 25 ug via INTRAVENOUS
  Administered 2013-02-14 (×3): 50 ug via INTRAVENOUS

## 2013-02-14 MED ORDER — PROPOFOL 10 MG/ML IV EMUL
INTRAVENOUS | Status: AC
  Filled 2013-02-14: qty 50

## 2013-02-14 MED ORDER — MIDAZOLAM HCL 2 MG/ML PO SYRP: 12.0000 mg | ORAL_SOLUTION | Freq: Once | ORAL | Status: DC | PRN

## 2013-02-14 MED ORDER — KETOROLAC TROMETHAMINE 30 MG/ML IJ SOLN
INTRAMUSCULAR | Status: DC | PRN
  Administered 2013-02-14: 30 mg via INTRAVENOUS

## 2013-02-14 MED ORDER — BUPIVACAINE-EPINEPHRINE PF 0.5-1:200000 % IJ SOLN
INTRAMUSCULAR | Status: AC
  Filled 2013-02-14: qty 30

## 2013-02-14 MED ORDER — MIDAZOLAM HCL 2 MG/2ML IJ SOLN: 1.0000 mg | INTRAMUSCULAR | Status: DC | PRN

## 2013-02-14 MED ORDER — PROPOFOL 10 MG/ML IV BOLUS
INTRAVENOUS | Status: DC | PRN
  Administered 2013-02-14: 200 mg via INTRAVENOUS

## 2013-02-14 MED ORDER — DEXAMETHASONE SODIUM PHOSPHATE 4 MG/ML IJ SOLN
INTRAMUSCULAR | Status: DC | PRN
  Administered 2013-02-14: 4 mg via INTRAVENOUS

## 2013-02-14 MED ORDER — FENTANYL CITRATE 0.05 MG/ML IJ SOLN: 50.0000 ug | INTRAMUSCULAR | Status: DC | PRN

## 2013-02-14 MED ORDER — BUPIVACAINE-EPINEPHRINE 0.5% -1:200000 IJ SOLN
INTRAMUSCULAR | Status: DC | PRN
  Administered 2013-02-14: 20 mL

## 2013-02-14 MED ORDER — OXYCODONE-ACETAMINOPHEN 5-325 MG PO TABS: 1.0000 | ORAL_TABLET | ORAL | Status: DC | PRN

## 2013-02-14 MED ORDER — MIDAZOLAM HCL 5 MG/5ML IJ SOLN
INTRAMUSCULAR | Status: DC | PRN
  Administered 2013-02-14: 2 mg via INTRAVENOUS

## 2013-02-14 MED ORDER — CEFAZOLIN SODIUM 1-5 GM-% IV SOLN
INTRAVENOUS | Status: AC
  Filled 2013-02-14: qty 100

## 2013-02-14 MED ORDER — CEFAZOLIN SODIUM-DEXTROSE 2-3 GM-% IV SOLR
2.0000 g | INTRAVENOUS | Status: AC
  Administered 2013-02-14: 2 g via INTRAVENOUS

## 2013-02-14 MED ORDER — 0.9 % SODIUM CHLORIDE (POUR BTL) OPTIME
TOPICAL | Status: DC | PRN
  Administered 2013-02-14: 300 mL

## 2013-02-14 MED ORDER — LACTATED RINGERS IV SOLN
INTRAVENOUS | Status: DC
  Administered 2013-02-14: 08:00:00 via INTRAVENOUS

## 2013-02-14 MED ORDER — OXYCODONE HCL 5 MG/5ML PO SOLN: 5.0000 mg | Freq: Once | ORAL | Status: DC | PRN

## 2013-02-14 MED ORDER — HYDROMORPHONE HCL PF 1 MG/ML IJ SOLN: 0.2500 mg | INTRAMUSCULAR | Status: DC | PRN

## 2013-02-14 MED ORDER — OXYCODONE HCL 5 MG PO TABS: 5.0000 mg | ORAL_TABLET | Freq: Once | ORAL | Status: DC | PRN

## 2013-02-14 SURGICAL SUPPLY — 37 items
BLADE HEX COATED 2.75 (ELECTRODE) ×2 IMPLANT
BLADE SURG 15 STRL LF DISP TIS (BLADE) ×1 IMPLANT
BLADE SURG 15 STRL SS (BLADE) ×2
BLADE SURG ROTATE 9660 (MISCELLANEOUS) ×1 IMPLANT
CANISTER SUCT 1200ML W/VALVE (MISCELLANEOUS) ×2 IMPLANT
CHLORAPREP W/TINT 26ML (MISCELLANEOUS) ×2 IMPLANT
COVER MAYO STAND STRL (DRAPES) ×2 IMPLANT
COVER TABLE BACK 60X90 (DRAPES) ×2 IMPLANT
DECANTER SPIKE VIAL GLASS SM (MISCELLANEOUS) IMPLANT
DRAPE PED LAPAROTOMY (DRAPES) ×2 IMPLANT
DRAPE UTILITY XL STRL (DRAPES) ×2 IMPLANT
ELECT REM PT RETURN 9FT ADLT (ELECTROSURGICAL) ×2
ELECTRODE REM PT RTRN 9FT ADLT (ELECTROSURGICAL) ×1 IMPLANT
GAUZE SPONGE 4X4 12PLY STRL LF (GAUZE/BANDAGES/DRESSINGS) ×2 IMPLANT
GAUZE XEROFORM 1X8 LF (GAUZE/BANDAGES/DRESSINGS) ×1 IMPLANT
GLOVE BIOGEL PI IND STRL 7.0 (GLOVE) IMPLANT
GLOVE BIOGEL PI INDICATOR 7.0 (GLOVE) ×1
GLOVE ECLIPSE 6.5 STRL STRAW (GLOVE) ×1 IMPLANT
GLOVE EXAM NITRILE MD LF STRL (GLOVE) ×1 IMPLANT
GLOVE SURG SIGNA 7.5 PF LTX (GLOVE) ×2 IMPLANT
GOWN PREVENTION PLUS XLARGE (GOWN DISPOSABLE) ×2 IMPLANT
GOWN PREVENTION PLUS XXLARGE (GOWN DISPOSABLE) ×2 IMPLANT
NDL HYPO 25X1 1.5 SAFETY (NEEDLE) ×1 IMPLANT
NEEDLE HYPO 25X1 1.5 SAFETY (NEEDLE) ×2 IMPLANT
NS IRRIG 1000ML POUR BTL (IV SOLUTION) ×2 IMPLANT
PACK BASIN DAY SURGERY FS (CUSTOM PROCEDURE TRAY) ×2 IMPLANT
PENCIL BUTTON HOLSTER BLD 10FT (ELECTRODE) ×2 IMPLANT
SLEEVE SCD COMPRESS KNEE MED (MISCELLANEOUS) ×1 IMPLANT
SPONGE LAP 4X18 X RAY DECT (DISPOSABLE) ×2 IMPLANT
SUT ETHILON 2 0 FS 18 (SUTURE) ×2 IMPLANT
SUT ETHILON 3 0 FSL (SUTURE) IMPLANT
SYR BULB 3OZ (MISCELLANEOUS) ×2 IMPLANT
SYR CONTROL 10ML LL (SYRINGE) ×2 IMPLANT
TOWEL OR 17X24 6PK STRL BLUE (TOWEL DISPOSABLE) ×2 IMPLANT
TOWEL OR NON WOVEN STRL DISP B (DISPOSABLE) ×1 IMPLANT
TUBE CONNECTING 20X1/4 (TUBING) ×2 IMPLANT
YANKAUER SUCT BULB TIP NO VENT (SUCTIONS) ×2 IMPLANT

## 2013-02-14 NOTE — Transfer of Care (Signed)
Immediate Anesthesia Transfer of Care Note  Patient: Melissa Cruz  Procedure(s) Performed: Procedure(s): WIDE EXCISION HIDRADENITIS RIGHT AXILLA (Right)  Patient Location: PACU  Anesthesia Type:General  Level of Consciousness: awake and patient cooperative  Airway & Oxygen Therapy: Patient Spontanous Breathing and Patient connected to face mask oxygen  Post-op Assessment: Report given to PACU RN and Post -op Vital signs reviewed and stable  Post vital signs: Reviewed and stable  Complications: No apparent anesthesia complications

## 2013-02-14 NOTE — H&P (Signed)
Melissa Cruz is an 32 y.o. female.   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 She is HIV-positive. Because of her recurrent infections, the decision has been made to proceed with wide excision of the axilla. She is currently without complaints today  Past Medical History  Diagnosis Date  . History of anemia   . Non-insulin dependent type 2 diabetes mellitus   . HIV positive   . Hydradenitis 02/2013    right axilla    Past Surgical History  Procedure Laterality Date  . Cholecystectomy  1999  . Tubal ligation  11/22/2005    Family History  Problem Relation Age of Onset  . Diabetes Maternal Uncle   . Cancer Maternal Grandmother   . Cancer Maternal Grandfather    Social History:  reports that she has been smoking Cigarettes.  She has a 3.5 pack-year smoking history. She has never used smokeless tobacco. She reports that she does not drink alcohol or use illicit drugs.  Allergies:  Allergies  Allergen Reactions  . Shellfish Allergy Shortness Of Breath  . Hydrocodone Nausea And Vomiting    Medications Prior to Admission  Medication Sig Dispense Refill  . elvitegravir-cobicistat-emtricitabine-tenofovir (STRIBILD) 150-150-200-300 MG TABS Take 1 tablet by mouth daily with breakfast.  90 tablet  3  . ferrous sulfate 325 (65 FE) MG tablet Take 1 tablet (325 mg total) by mouth 2 (two) times daily.  60 tablet  3  . metFORMIN (GLUCOPHAGE) 500 MG tablet Take 500 mg by mouth 3 (three) times daily.      . Blood Glucose Monitoring Suppl (BLOOD GLUCOSE METER) kit Use as instructed  1 each  0  . glucose blood test strip Use as instructed  100 each  12    Results for orders placed during the hospital encounter of 02/14/13 (from the past 48 hour(s))  BASIC METABOLIC PANEL     Status: Abnormal   Collection Time    02/13/13 12:10 PM      Result Value Range   Sodium 135  135 -  145 mEq/L   Potassium 4.4  3.5 - 5.1 mEq/L   Chloride 100  96 - 112 mEq/L   CO2 25  19 - 32 mEq/L   Glucose, Bld 181 (*) 70 - 99 mg/dL   BUN 6  6 - 23 mg/dL   Creatinine, Ser 1.61  0.50 - 1.10 mg/dL   Calcium 9.2  8.4 - 09.6 mg/dL   GFR calc non Af Amer >90  >90 mL/min   GFR calc Af Amer >90  >90 mL/min   Comment: (NOTE)     The eGFR has been calculated using the CKD EPI equation.     This calculation has not been validated in all clinical situations.     eGFR's persistently <90 mL/min signify possible Chronic Kidney     Disease.   No results found.  Review of Systems  All other systems reviewed and are negative.    Blood pressure 118/79, pulse 91, temperature 98.1 F (36.7 C), temperature source Oral, resp. rate 18, height 5\' 3"  (1.6 m), weight 213 lb (96.616 kg), last menstrual period 02/13/2013, SpO2 97.00%. Physical Exam  Constitutional: She is oriented to person, place, and time. She appears well-developed and well-nourished. No distress.  HENT:  Head: Normocephalic and atraumatic.  Eyes: Conjunctivae are normal. Pupils are equal, round, and reactive to light.  Neck: Normal range  of motion. Neck supple.  Cardiovascular: Normal rate, regular rhythm and normal heart sounds.   Respiratory: Effort normal and breath sounds normal. No respiratory distress.  GI: Soft. There is no tenderness.  Musculoskeletal: Normal range of motion. She exhibits no edema and no tenderness.  Neurological: She is alert and oriented to person, place, and time.  Skin:  Chronic draining sinus tracts in the right axilla  Psychiatric: Her behavior is normal. Judgment normal.     Assessment/Plan Right axillary hidradenitis  We will now proceed with wide excision of the right axilla. The risks including bleeding, infection, having a chronic open wound, recurrence, and the fact that this is not a curable disease were again discussed in detail. She understands and wishes to proceed. Surgery is  scheduled.  Ramil Edgington A 02/14/2013, 7:32 AM

## 2013-02-14 NOTE — Op Note (Signed)
WIDE EXCISION HIDRADENITIS RIGHT AXILLA  Procedure Note  Melissa Cruz 02/14/2013   Pre-op Diagnosis: Hydradenitis right axilla     Post-op Diagnosis: same  Procedure(s): WIDE EXCISION HIDRADENITIS RIGHT AXILLA (10 cm by 5cm by 1.5 cm skin and subcut tissue)  Surgeon(s): Shelly Rubenstein, MD  Anesthesia: General  Staff:  Circulator: Raliegh Scarlet, RN Scrub Person: Randalyn Rhea, RN  Estimated Blood Loss: Minimal               Specimens: sent to path          [facility] A   Date: 02/14/2013  Time: 8:58 AM

## 2013-02-14 NOTE — Anesthesia Postprocedure Evaluation (Signed)
Anesthesia Post Note  Patient: Melissa Cruz  Procedure(s) Performed: Procedure(s) (LRB): WIDE EXCISION HIDRADENITIS RIGHT AXILLA (Right)  Anesthesia type: General  Patient location: PACU  Post pain: Pain level controlled and Adequate analgesia  Post assessment: Post-op Vital signs reviewed, Patient's Cardiovascular Status Stable, Respiratory Function Stable, Patent Airway and Pain level controlled  Last Vitals:  Filed Vitals:   02/14/13 1037  BP: 128/83  Pulse: 72  Temp: 36.7 C  Resp: 16    Post vital signs: Reviewed and stable  Level of consciousness: awake, alert  and oriented  Complications: No apparent anesthesia complications

## 2013-02-14 NOTE — Transfer of Care (Addendum)
Immediate Anesthesia Transfer of Care Note  Patient: Melissa Cruz  Procedure(s) Performed: Procedure(s): WIDE EXCISION HIDRADENITIS RIGHT AXILLA (Right)  Patient Location: PACU  Anesthesia Type:General  Level of Consciousness: awake and patient cooperative  Airway & Oxygen Therapy: Patient Spontanous Breathing and Patient connected to face mask oxygen  Post-op Assessment: Report given to PACU RN and Post -op Vital signs reviewed and stable  Post vital signs: Reviewed and stable  Complications: No apparent anesthesia complications 

## 2013-02-14 NOTE — Anesthesia Preprocedure Evaluation (Addendum)
Anesthesia Evaluation  Patient identified by MRN, date of birth, ID band Patient awake    Reviewed: Allergy & Precautions, NPO status   Airway Mallampati: II  Neck ROM: full    Dental   Pulmonary Current Smoker,          Cardiovascular     Neuro/Psych    GI/Hepatic   Endo/Other  diabetes, Type 2, Oral Hypoglycemic AgentsMorbid obesity  Renal/GU      Musculoskeletal   Abdominal   Peds  Hematology  (+) HIV, History of anemia, HIV+   Anesthesia Other Findings   Reproductive/Obstetrics                          Anesthesia Physical Anesthesia Plan  ASA: III  Anesthesia Plan: General   Post-op Pain Management:    Induction: Intravenous  Airway Management Planned: LMA  Additional Equipment:   Intra-op Plan:   Post-operative Plan:   Informed Consent: I have reviewed the patients History and Physical, chart, labs and discussed the procedure including the risks, benefits and alternatives for the proposed anesthesia with the patient or authorized representative who has indicated his/her understanding and acceptance.     Plan Discussed with: CRNA, Anesthesiologist and Surgeon  Anesthesia Plan Comments:         Anesthesia Quick Evaluation

## 2013-02-15 ENCOUNTER — Encounter (HOSPITAL_BASED_OUTPATIENT_CLINIC_OR_DEPARTMENT_OTHER): Payer: Self-pay | Admitting: Surgery

## 2013-02-16 NOTE — Op Note (Signed)
[name], [name]:  [id number]  MEDICAL RECORD NO.:  [id number]  LOCATION:                               FACILITY:  [facility]  PHYSICIAN:  Abigail Miyamoto, M.D. DATE OF BIRTH:  [birth year]  DATE OF PROCEDURE:  02/14/2013 DATE OF DISCHARGE:  02/14/2013                              OPERATIVE REPORT   PREOPERATIVE DIAGNOSIS:  Chronic hydradenitis of the right axilla.  POSTOPERATIVE DIAGNOSIS:  Chronic hydradenitis of the right axilla.  PROCEDURE:  Wide excision of chronic hydradenitis of the right axilla (10 cm x 5 cm x 1.5 cm skin and subcutaneous tissue).  SURGEON:  Abigail Miyamoto, MD  ANESTHESIA:  General and 0.5% Marcaine.  ESTIMATED BLOOD LOSS:  Minimal.  INDICATIONS:  This is a 32 year old female, who is HIV positive, who has had recurrent abscesses from hidradenitis in the left axilla.  Decision was made to proceed with wide excision of this area.  PROCEDURE IN DETAIL:  The patient was brought to the operating room, identified, as Melissa Cruz.  She was placed supine on the operating table.  General anesthesia was induced.  Her right axilla was then prepped and draped in usual sterile fashion.  The skin was then anesthetized with 5% Marcaine with epinephrine.  Using a #15 blade scalpel, I then did a wide elliptical incision of the area of the chronic draining sinus tracts.  I took this down to subcutaneous tissue with a scalpel and then the electrocautery.  I excised all the chronic draining tissue going down into the axilla with the electrocautery.  The specimen was 10 cm x 5 cm x 0.5 cm in size and contains skin and subcutaneous tissue.  The rest of the axilla appeared normal.  I anesthetized the wound further with 0.5% Marcaine with epinephrine.  I irrigated the wound with saline.  Hemostasis appeared to be achieved.  I then closed the wound with interrupted 2-0 nylon sutures.  Gauze and tape were then applied. The patient tolerated the  procedure well.  All the counts were correct at the end of the procedure.  The patient was then extubated in the operating room and taken in stable condition to recovery room.     Abigail Miyamoto, M.D.   ______________________________ Abigail Miyamoto, M.D.    DB/MEDQ  D:  02/14/2013  T:  02/15/2013  Job:  [id number]

## 2013-02-19 ENCOUNTER — Encounter: Payer: Self-pay | Admitting: *Deleted

## 2013-02-19 ENCOUNTER — Ambulatory Visit (INDEPENDENT_AMBULATORY_CARE_PROVIDER_SITE_OTHER): Payer: Commercial Indemnity | Admitting: Surgery

## 2013-02-19 ENCOUNTER — Telehealth: Payer: Self-pay | Admitting: *Deleted

## 2013-02-19 ENCOUNTER — Encounter (INDEPENDENT_AMBULATORY_CARE_PROVIDER_SITE_OTHER): Payer: Self-pay | Admitting: Surgery

## 2013-02-19 VITALS — BP 106/78 | HR 68 | Temp 98.1°F | Resp 12 | Ht 63.0 in | Wt 214.2 lb

## 2013-02-19 DIAGNOSIS — Z09 Encounter for follow-up examination after completed treatment for conditions other than malignant neoplasm: Secondary | ICD-10-CM

## 2013-02-19 NOTE — Telephone Encounter (Signed)
Requested pt call [facility] for appts.  Number left.

## 2013-02-19 NOTE — Progress Notes (Signed)
Subjective:     Patient ID: Melissa Cruz, female   DOB: 12/09/1980, 32 y.o.   MRN: [id number]  HPI She is here for her first postop visit status post wide excision of hidradenitis in the right axilla. She is doing very well and has no complaints  Review of Systems     Objective:   Physical Exam On exam, the sutures are intact in the right axilla. The incision is healing well    Assessment:     Patient stable postop     Plan:     I will see her back in one week to see if the sutures are ready to come out. Currently, her return to work date is uncertain

## 2013-02-27 ENCOUNTER — Encounter (INDEPENDENT_AMBULATORY_CARE_PROVIDER_SITE_OTHER): Payer: Self-pay | Admitting: Surgery

## 2013-02-27 ENCOUNTER — Encounter (INDEPENDENT_AMBULATORY_CARE_PROVIDER_SITE_OTHER): Payer: Self-pay

## 2013-02-27 ENCOUNTER — Ambulatory Visit (INDEPENDENT_AMBULATORY_CARE_PROVIDER_SITE_OTHER): Payer: Commercial Indemnity | Admitting: Surgery

## 2013-02-27 VITALS — BP 122/70 | HR 88 | Temp 97.7°F | Resp 18 | Ht 63.0 in | Wt 213.8 lb

## 2013-02-27 DIAGNOSIS — Z09 Encounter for follow-up examination after completed treatment for conditions other than malignant neoplasm: Secondary | ICD-10-CM

## 2013-02-27 NOTE — Progress Notes (Signed)
Subjective:     Patient ID: Melissa Cruz, female   DOB: 1980-12-01, 32 y.o.   MRN: [id number]  HPI She is here for another postoperative visit regarding the wide excision of the hidradenitis in the right axilla. She is having some increased drainage.  Review of Systems     Objective:   Physical Exam The sutures remain intact to the right axilla. There is some serosanguineous drainage but no erythema or over. I believe approximate 75% of the incision is healing well    Assessment:     Patient stable postop     Plan:     I am going to leave the sutures in for another week. I will see her back at that time and then we'll determine her return to work date

## 2013-03-06 ENCOUNTER — Ambulatory Visit (INDEPENDENT_AMBULATORY_CARE_PROVIDER_SITE_OTHER): Payer: Commercial Indemnity | Admitting: Surgery

## 2013-03-06 ENCOUNTER — Encounter (INDEPENDENT_AMBULATORY_CARE_PROVIDER_SITE_OTHER): Payer: Self-pay | Admitting: Surgery

## 2013-03-06 ENCOUNTER — Encounter (INDEPENDENT_AMBULATORY_CARE_PROVIDER_SITE_OTHER): Payer: Self-pay

## 2013-03-06 VITALS — BP 110/68 | HR 96 | Temp 99.0°F | Resp 15 | Ht 63.0 in | Wt 212.8 lb

## 2013-03-06 DIAGNOSIS — Z09 Encounter for follow-up examination after completed treatment for conditions other than malignant neoplasm: Secondary | ICD-10-CM

## 2013-03-06 MED ORDER — TRAMADOL HCL 50 MG PO TABS: 50.0000 mg | ORAL_TABLET | Freq: Four times a day (QID) | ORAL | Status: DC | PRN

## 2013-03-06 NOTE — Progress Notes (Signed)
Subjective:     Patient ID: Melissa Cruz, female   DOB: Oct 12, 1980, 32 y.o.   MRN: [id number]  HPI She is here for another postoperative visit. She reports mild drainage from the wound  Review of Systems     Objective:   Physical Exam I was able to remove all her sutures. Only a small area opened up in the right axilla. There was no purulence. I treated the rest of the wound with Dermabond    Assessment:     Patient stable postop     Plan:     She will continue to wash the wound with soap and water. I will write her for tramadol. I will see her back in one week. I suspect she will be ready to return to work to full duty on December 10

## 2013-03-06 NOTE — Addendum Note (Signed)
Addended by: Abigail Miyamoto A on: 03/06/2013 12:05 PM   Modules accepted: Orders

## 2013-03-13 ENCOUNTER — Ambulatory Visit (INDEPENDENT_AMBULATORY_CARE_PROVIDER_SITE_OTHER): Payer: Commercial Indemnity | Admitting: Surgery

## 2013-03-13 ENCOUNTER — Encounter (INDEPENDENT_AMBULATORY_CARE_PROVIDER_SITE_OTHER): Payer: Self-pay | Admitting: Surgery

## 2013-03-13 VITALS — BP 118/68 | HR 72 | Temp 98.2°F | Resp 14 | Ht 63.0 in | Wt 214.0 lb

## 2013-03-13 DIAGNOSIS — Z09 Encounter for follow-up examination after completed treatment for conditions other than malignant neoplasm: Secondary | ICD-10-CM

## 2013-03-13 NOTE — Progress Notes (Signed)
Subjective:     Patient ID: Melissa Cruz, female   DOB: 04/25/1980, 32 y.o.   MRN: [id number]  HPI She is here for another postop visit and wound check. She reports minimal drainage from the wound and is doing well with minimal discomfort.  Review of Systems     Objective:   Physical Exam The wound is now healing well. There is a small area which is 1/2 cm in size. I cleaned with a Q-tip treated with silver nitrate.    Assessment:     Patient stable postop     Plan:     She will continue her current wound care. She'll return to work tomorrow. I will see her back after Christmas

## 2013-04-02 ENCOUNTER — Encounter (INDEPENDENT_AMBULATORY_CARE_PROVIDER_SITE_OTHER): Payer: Commercial Indemnity | Admitting: Surgery

## 2013-04-03 ENCOUNTER — Encounter (INDEPENDENT_AMBULATORY_CARE_PROVIDER_SITE_OTHER): Payer: Self-pay | Admitting: Surgery

## 2013-04-16 ENCOUNTER — Encounter (INDEPENDENT_AMBULATORY_CARE_PROVIDER_SITE_OTHER): Payer: Self-pay | Admitting: Surgery

## 2013-04-16 ENCOUNTER — Ambulatory Visit (INDEPENDENT_AMBULATORY_CARE_PROVIDER_SITE_OTHER): Payer: Commercial Indemnity | Admitting: Surgery

## 2013-04-16 VITALS — BP 112/68 | HR 88 | Temp 98.0°F | Resp 14 | Ht 63.0 in | Wt 216.2 lb

## 2013-04-16 DIAGNOSIS — Z09 Encounter for follow-up examination after completed treatment for conditions other than malignant neoplasm: Secondary | ICD-10-CM

## 2013-04-16 NOTE — Progress Notes (Signed)
Subjective:     Patient ID: Melissa Cruz, female   DOB: 12-13-1980, 33 y.o.   MRN: [id number]  HPI She is here for another postoperative visit. She has no complaints and reports that her wounds were completely closed  Review of Systems     Objective:   Physical Exam On exam, her right axilla she is completely healed with no evidence of hidradenitis currently    Assessment:     Patient stable postop     Plan:     I will see her back as needed. Should she develop any areas of drainage in the axilla she will coming back as soon as possible

## 2013-04-17 ENCOUNTER — Encounter (INDEPENDENT_AMBULATORY_CARE_PROVIDER_SITE_OTHER): Payer: Self-pay | Admitting: General Surgery

## 2013-04-17 ENCOUNTER — Telehealth (INDEPENDENT_AMBULATORY_CARE_PROVIDER_SITE_OTHER): Payer: Self-pay

## 2013-04-17 ENCOUNTER — Encounter: Payer: Self-pay | Admitting: Internal Medicine

## 2013-04-17 NOTE — Telephone Encounter (Signed)
Pt calling to get a RTW note faxed to her job attn: Melissa PalingJoella Wagh [phone] stating that she was released to go back to work on 03/14/13 full duty with no restrictions. The pt said she has been calling here since last Thursday trying to get this note sent for her but I advised pt that there is no record at all of her calls to our office. The pt said she requested this note back in December but her job never received it.

## 2013-04-19 ENCOUNTER — Telehealth: Payer: Self-pay | Admitting: *Deleted

## 2013-04-19 NOTE — Telephone Encounter (Signed)
Requested pt walk-in 1/2/015 when Dr. Luciana Axeomer has clinic.

## 2013-04-26 ENCOUNTER — Other Ambulatory Visit: Payer: Self-pay | Admitting: Internal Medicine

## 2013-04-27 ENCOUNTER — Other Ambulatory Visit: Payer: Self-pay | Admitting: *Deleted

## 2013-04-27 DIAGNOSIS — B20 Human immunodeficiency virus [HIV] disease: Secondary | ICD-10-CM

## 2013-04-27 MED ORDER — ELVITEG-COBIC-EMTRICIT-TENOFDF 150-150-200-300 MG PO TABS: ORAL_TABLET | ORAL | Status: DC

## 2013-04-30 ENCOUNTER — Other Ambulatory Visit: Payer: Self-pay | Admitting: Internal Medicine

## 2013-04-30 DIAGNOSIS — B20 Human immunodeficiency virus [HIV] disease: Secondary | ICD-10-CM

## 2013-05-01 ENCOUNTER — Other Ambulatory Visit: Payer: Managed Care, Other (non HMO)

## 2013-05-01 DIAGNOSIS — B20 Human immunodeficiency virus [HIV] disease: Secondary | ICD-10-CM

## 2013-05-01 DIAGNOSIS — Z79899 Other long term (current) drug therapy: Secondary | ICD-10-CM

## 2013-05-01 DIAGNOSIS — Z113 Encounter for screening for infections with a predominantly sexual mode of transmission: Secondary | ICD-10-CM

## 2013-05-01 LAB — CBC WITH DIFFERENTIAL/PLATELET
BASOS ABS: 0 10*3/uL (ref 0.0–0.1)
Basophils Relative: 0 % (ref 0–1)
EOS ABS: 0.1 10*3/uL (ref 0.0–0.7)
EOS PCT: 3 % (ref 0–5)
HCT: 35.1 % — ABNORMAL LOW (ref 36.0–46.0)
Hemoglobin: 11.5 g/dL — ABNORMAL LOW (ref 12.0–15.0)
LYMPHS PCT: 35 % (ref 12–46)
Lymphs Abs: 2 10*3/uL (ref 0.7–4.0)
MCH: 26.9 pg (ref 26.0–34.0)
MCHC: 32.8 g/dL (ref 30.0–36.0)
MCV: 82.2 fL (ref 78.0–100.0)
MONO ABS: 0.4 10*3/uL (ref 0.1–1.0)
Monocytes Relative: 7 % (ref 3–12)
Neutro Abs: 3.1 10*3/uL (ref 1.7–7.7)
Neutrophils Relative %: 55 % (ref 43–77)
Platelets: 360 10*3/uL (ref 150–400)
RBC: 4.27 MIL/uL (ref 3.87–5.11)
RDW: 15 % (ref 11.5–15.5)
WBC: 5.7 10*3/uL (ref 4.0–10.5)

## 2013-05-01 LAB — LIPID PANEL
CHOL/HDL RATIO: 4.6 ratio
Cholesterol: 152 mg/dL (ref 0–200)
HDL: 33 mg/dL — AB (ref >39)
LDL Cholesterol: 75 mg/dL (ref 0–99)
Triglycerides: 218 mg/dL — ABNORMAL HIGH (ref <150)
VLDL: 44 mg/dL — AB (ref 0–40)

## 2013-05-01 LAB — COMPLETE METABOLIC PANEL WITH GFR
ALBUMIN: 3.8 g/dL (ref 3.5–5.2)
ALT: 9 U/L (ref 0–35)
AST: 9 U/L (ref 0–37)
Alkaline Phosphatase: 99 U/L (ref 39–117)
BUN: 7 mg/dL (ref 6–23)
CALCIUM: 9 mg/dL (ref 8.4–10.5)
CHLORIDE: 102 meq/L (ref 96–112)
CO2: 26 mEq/L (ref 19–32)
Creat: 0.51 mg/dL (ref 0.50–1.10)
GFR, Est African American: >89 mL/min
GFR, Est Non African American: >89 mL/min
Glucose, Bld: 304 mg/dL — ABNORMAL HIGH (ref 70–99)
POTASSIUM: 4.1 meq/L (ref 3.5–5.3)
Sodium: 134 mEq/L — ABNORMAL LOW (ref 135–145)
TOTAL PROTEIN: 6.4 g/dL (ref 6.0–8.3)
Total Bilirubin: 0.2 mg/dL — ABNORMAL LOW (ref 0.3–1.2)

## 2013-05-01 NOTE — Addendum Note (Signed)
Addended bySteva Colder: Saachi Zale on: 05/01/2013 02:40 PM   Modules accepted: Orders

## 2013-05-02 LAB — HIV-1 RNA QUANT-NO REFLEX-BLD
HIV 1 RNA Quant: <20 copies/mL (ref <20)
HIV-1 RNA Quant, Log: <1.3 {Log} (ref <1.30)

## 2013-05-02 LAB — RPR

## 2013-05-02 LAB — T-HELPER CELL (CD4) - (RCID CLINIC ONLY)
CD4 % Helper T Cell: 21 % — ABNORMAL LOW (ref 33–55)
CD4 T CELL ABS: 450 /uL (ref 400–2700)

## 2013-05-15 ENCOUNTER — Encounter: Payer: Self-pay | Admitting: Internal Medicine

## 2013-05-15 ENCOUNTER — Ambulatory Visit: Payer: Managed Care, Other (non HMO) | Admitting: Internal Medicine

## 2013-05-15 ENCOUNTER — Ambulatory Visit (INDEPENDENT_AMBULATORY_CARE_PROVIDER_SITE_OTHER): Payer: Managed Care, Other (non HMO) | Admitting: Internal Medicine

## 2013-05-15 VITALS — BP 109/74 | HR 98 | Temp 98.1°F | Ht 63.0 in | Wt 218.0 lb

## 2013-05-15 DIAGNOSIS — E118 Type 2 diabetes mellitus with unspecified complications: Secondary | ICD-10-CM

## 2013-05-15 DIAGNOSIS — L732 Hidradenitis suppurativa: Secondary | ICD-10-CM

## 2013-05-15 DIAGNOSIS — B20 Human immunodeficiency virus [HIV] disease: Secondary | ICD-10-CM

## 2013-05-15 DIAGNOSIS — E1165 Type 2 diabetes mellitus with hyperglycemia: Secondary | ICD-10-CM

## 2013-05-15 DIAGNOSIS — Z23 Encounter for immunization: Secondary | ICD-10-CM

## 2013-05-15 DIAGNOSIS — IMO0002 Reserved for concepts with insufficient information to code with codable children: Secondary | ICD-10-CM

## 2013-05-15 MED ORDER — ELVITEG-COBIC-EMTRICIT-TENOFDF 150-150-200-300 MG PO TABS: ORAL_TABLET | ORAL | Status: DC

## 2013-05-15 NOTE — Assessment & Plan Note (Signed)
Healed after recent surgery.

## 2013-05-15 NOTE — Assessment & Plan Note (Signed)
I discussed the urgent need to improve her diabetes care and that there are other treatment options if metformin is intolerant for her.  She is going to schedule with Dr. Glendell DockerKomanski asap to consider other oral meds.  She will get eye exam.

## 2013-05-15 NOTE — Assessment & Plan Note (Signed)
Doing well from this standpoint, rtc 6 months

## 2013-05-15 NOTE — Progress Notes (Signed)
  Subjective:    Patient ID: Melissa Cruz, female    DOB: 1980-08-15, 33 y.o.   MRN: [id number]  HPI  She comes in here for followup of her HIV. She is on Stribild for her HIV and has good tolerance and excellent compliance with no missed doses. Her CD4 count is now 400 with an undetectable viral load still. She feels well and is pleased with the regimen. She has had no rash, diarrhea or other side effects including no nausea with the medication.  For her diabetes, she was started on metformin twice a day however due to some intolerance with the medication and GI upset, she stopped again recently. Her A1c is high and last glucose over 300 with her current labs, which is much worse than before.  She is going to schedule her pap smear, eye exam and go back to Dr. Jennelle HumanKominski.     Review of Systems  Constitutional: Negative for fever, appetite change, fatigue and unexpected weight change.  HENT: Negative for sore throat and trouble swallowing.   Eyes: Negative for visual disturbance.  Respiratory: Negative for cough and shortness of breath.   Cardiovascular: Negative for leg swelling.  Gastrointestinal: Negative for nausea, abdominal pain, diarrhea and abdominal distention.       Ok off of metformin  Musculoskeletal: Negative for arthralgias and myalgias.  Skin: Negative for rash.  Neurological: Negative for dizziness, light-headedness and headaches.  Hematological: Negative for adenopathy.       Objective:   Physical Exam  Constitutional: She appears well-developed and well-nourished. No distress.  HENT:  Mouth/Throat: No oropharyngeal exudate.  Eyes: No scleral icterus.  Cardiovascular: Normal rate, regular rhythm and normal heart sounds.  Exam reveals no gallop and no friction rub.   No murmur heard. Pulmonary/Chest: Effort normal and breath sounds normal. No respiratory distress. She has no wheezes.  Abdominal: Soft. Bowel sounds are normal. She exhibits no distension. There is no  tenderness.  Lymphadenopathy:    She has no cervical adenopathy.  Skin: No rash noted.          Assessment & Plan:

## 2013-06-11 ENCOUNTER — Telehealth: Payer: Self-pay | Admitting: *Deleted

## 2013-06-11 NOTE — Telephone Encounter (Signed)
Needing annual PAP smear appt

## 2013-07-10 ENCOUNTER — Encounter: Payer: Self-pay | Admitting: Internal Medicine

## 2013-10-30 ENCOUNTER — Other Ambulatory Visit: Payer: Managed Care, Other (non HMO)

## 2013-11-13 ENCOUNTER — Ambulatory Visit: Payer: Managed Care, Other (non HMO) | Admitting: Internal Medicine

## 2013-12-06 ENCOUNTER — Ambulatory Visit: Payer: Managed Care, Other (non HMO) | Admitting: Internal Medicine

## 2013-12-11 ENCOUNTER — Other Ambulatory Visit: Payer: Managed Care, Other (non HMO)

## 2013-12-11 DIAGNOSIS — B20 Human immunodeficiency virus [HIV] disease: Secondary | ICD-10-CM

## 2013-12-12 LAB — T-HELPER CELL (CD4) - (RCID CLINIC ONLY)
CD4 % Helper T Cell: 18 % — ABNORMAL LOW (ref 33–55)
CD4 T Cell Abs: 350 /uL — ABNORMAL LOW (ref 400–2700)

## 2013-12-12 LAB — HIV-1 RNA QUANT-NO REFLEX-BLD

## 2013-12-25 ENCOUNTER — Encounter: Payer: Self-pay | Admitting: Internal Medicine

## 2013-12-25 ENCOUNTER — Other Ambulatory Visit: Payer: Self-pay | Admitting: *Deleted

## 2013-12-25 ENCOUNTER — Ambulatory Visit (INDEPENDENT_AMBULATORY_CARE_PROVIDER_SITE_OTHER): Payer: Managed Care, Other (non HMO) | Admitting: Internal Medicine

## 2013-12-25 VITALS — BP 115/76 | HR 116 | Temp 98.3°F | Wt 213.0 lb

## 2013-12-25 DIAGNOSIS — B20 Human immunodeficiency virus [HIV] disease: Secondary | ICD-10-CM

## 2013-12-25 DIAGNOSIS — E118 Type 2 diabetes mellitus with unspecified complications: Secondary | ICD-10-CM

## 2013-12-25 DIAGNOSIS — IMO0002 Reserved for concepts with insufficient information to code with codable children: Secondary | ICD-10-CM

## 2013-12-25 DIAGNOSIS — Z113 Encounter for screening for infections with a predominantly sexual mode of transmission: Secondary | ICD-10-CM

## 2013-12-25 DIAGNOSIS — Z79899 Other long term (current) drug therapy: Secondary | ICD-10-CM

## 2013-12-25 DIAGNOSIS — F17200 Nicotine dependence, unspecified, uncomplicated: Secondary | ICD-10-CM

## 2013-12-25 DIAGNOSIS — E1165 Type 2 diabetes mellitus with hyperglycemia: Secondary | ICD-10-CM

## 2013-12-25 DIAGNOSIS — Z23 Encounter for immunization: Secondary | ICD-10-CM

## 2013-12-25 MED ORDER — ELVITEG-COBIC-EMTRICIT-TENOFDF 150-150-200-300 MG PO TABS: ORAL_TABLET | ORAL | Status: DC

## 2013-12-25 NOTE — Progress Notes (Signed)
  Subjective:    Patient ID: Melissa Cruz, female    DOB: 02-Feb-1981, 33 y.o.   MRN: [id number]  HPI She comes in here for followup of her HIV. She is on Stribild for her HIV and has good tolerance and excellent compliance with no missed doses. Her CD4 count is 350 with an undetectable viral load still. She feels well and is pleased with the regimen. She has had no rash, diarrhea or other side effects including no nausea with the medication.  For her diabetes, she was started on metformin twice a day however due to some intolerance with the medication and GI upset, she stopped earlier this year.  She has not yet gone back to her PCP.  She is going to get pap Oct 4.     Review of Systems  Constitutional: Negative for fever, appetite change, fatigue and unexpected weight change.  HENT: Negative for sore throat and trouble swallowing.   Eyes: Negative for visual disturbance.  Respiratory: Negative for cough and shortness of breath.   Cardiovascular: Negative for leg swelling.  Gastrointestinal: Negative for nausea, abdominal pain, diarrhea and abdominal distention.       Ok off of metformin  Musculoskeletal: Negative for arthralgias and myalgias.  Skin: Negative for rash.  Neurological: Negative for dizziness, light-headedness and headaches.  Hematological: Negative for adenopathy.       Objective:   Physical Exam  Constitutional: She appears well-developed and well-nourished. No distress.  HENT:  Mouth/Throat: No oropharyngeal exudate.  Eyes: No scleral icterus.  Cardiovascular: Normal rate, regular rhythm and normal heart sounds.  Exam reveals no gallop and no friction rub.   No murmur heard. Pulmonary/Chest: Effort normal and breath sounds normal. No respiratory distress. She has no wheezes.  Abdominal: Soft. Bowel sounds are normal. She exhibits no distension. There is no tenderness.  Lymphadenopathy:    She has no cervical adenopathy.  Skin: No rash noted.           Assessment & Plan:

## 2013-12-25 NOTE — Assessment & Plan Note (Signed)
Emphasized the absolute need to get on meds.

## 2013-12-25 NOTE — Assessment & Plan Note (Signed)
Doing great.  RTC 6 months.  

## 2013-12-25 NOTE — Assessment & Plan Note (Signed)
Emphasized the need to quit.

## 2014-01-04 ENCOUNTER — Ambulatory Visit: Payer: Managed Care, Other (non HMO)

## 2014-01-04 ENCOUNTER — Telehealth: Payer: Self-pay | Admitting: *Deleted

## 2014-01-04 NOTE — Telephone Encounter (Signed)
Requested that the pt call for a new appt for PAP smear.

## 2014-01-09 ENCOUNTER — Telehealth: Payer: Self-pay | Admitting: *Deleted

## 2014-01-09 NOTE — Telephone Encounter (Signed)
Pt needing to make another PAP smear appt.

## 2014-06-10 ENCOUNTER — Other Ambulatory Visit: Payer: Managed Care, Other (non HMO)

## 2014-06-24 ENCOUNTER — Ambulatory Visit: Payer: Managed Care, Other (non HMO) | Admitting: Internal Medicine

## 2014-07-10 ENCOUNTER — Other Ambulatory Visit: Payer: Managed Care, Other (non HMO)

## 2014-07-11 ENCOUNTER — Other Ambulatory Visit: Payer: Managed Care, Other (non HMO)

## 2014-07-15 ENCOUNTER — Other Ambulatory Visit: Payer: Managed Care, Other (non HMO)

## 2014-07-15 ENCOUNTER — Other Ambulatory Visit (HOSPITAL_COMMUNITY)
Admission: RE | Admit: 2014-07-15 | Payer: BLUE CROSS/BLUE SHIELD | Source: Ambulatory Visit | Admitting: Internal Medicine

## 2014-07-15 DIAGNOSIS — Z79899 Other long term (current) drug therapy: Secondary | ICD-10-CM

## 2014-07-15 DIAGNOSIS — B20 Human immunodeficiency virus [HIV] disease: Secondary | ICD-10-CM

## 2014-07-15 DIAGNOSIS — Z113 Encounter for screening for infections with a predominantly sexual mode of transmission: Secondary | ICD-10-CM

## 2014-07-15 LAB — CBC WITH DIFFERENTIAL/PLATELET
BASOS ABS: 0 10*3/uL (ref 0.0–0.1)
BASOS PCT: 0 % (ref 0–1)
EOS ABS: 0.1 10*3/uL (ref 0.0–0.7)
Eosinophils Relative: 1 % (ref 0–5)
HCT: 39.4 % (ref 36.0–46.0)
Hemoglobin: 12.8 g/dL (ref 12.0–15.0)
Lymphocytes Relative: 38 % (ref 12–46)
Lymphs Abs: 3.2 10*3/uL (ref 0.7–4.0)
MCH: 25.6 pg — ABNORMAL LOW (ref 26.0–34.0)
MCHC: 32.5 g/dL (ref 30.0–36.0)
MCV: 78.8 fL (ref 78.0–100.0)
MPV: 9.5 fL (ref 8.6–12.4)
Monocytes Absolute: 0.5 10*3/uL (ref 0.1–1.0)
Monocytes Relative: 6 % (ref 3–12)
NEUTROS ABS: 4.6 10*3/uL (ref 1.7–7.7)
Neutrophils Relative %: 55 % (ref 43–77)
PLATELETS: 388 10*3/uL (ref 150–400)
RBC: 5 MIL/uL (ref 3.87–5.11)
RDW: 15.4 % (ref 11.5–15.5)
WBC: 8.3 10*3/uL (ref 4.0–10.5)

## 2014-07-15 LAB — COMPLETE METABOLIC PANEL WITH GFR
ALBUMIN: 4 g/dL (ref 3.5–5.2)
ALK PHOS: 101 U/L (ref 39–117)
ALT: 11 U/L (ref 0–35)
AST: 12 U/L (ref 0–37)
BILIRUBIN TOTAL: 0.4 mg/dL (ref 0.2–1.2)
BUN: 6 mg/dL (ref 6–23)
CO2: 26 mEq/L (ref 19–32)
Calcium: 9.5 mg/dL (ref 8.4–10.5)
Chloride: 96 mEq/L (ref 96–112)
Creat: 0.57 mg/dL (ref 0.50–1.10)
GFR, Est African American: >89 mL/min
GFR, Est Non African American: >89 mL/min
GLUCOSE: 257 mg/dL — AB (ref 70–99)
Potassium: 4.3 mEq/L (ref 3.5–5.3)
SODIUM: 133 meq/L — AB (ref 135–145)
TOTAL PROTEIN: 7 g/dL (ref 6.0–8.3)

## 2014-07-15 LAB — LIPID PANEL
Cholesterol: 202 mg/dL — ABNORMAL HIGH (ref 0–200)
HDL: 42 mg/dL — ABNORMAL LOW (ref >=46)
LDL CALC: 136 mg/dL — AB (ref 0–99)
Total CHOL/HDL Ratio: 4.8 Ratio
Triglycerides: 121 mg/dL (ref <150)
VLDL: 24 mg/dL (ref 0–40)

## 2014-07-16 LAB — T-HELPER CELL (CD4) - (RCID CLINIC ONLY)
CD4 % Helper T Cell: 20 % — ABNORMAL LOW (ref 33–55)
CD4 T Cell Abs: 610 /uL (ref 400–2700)

## 2014-07-16 LAB — URINE CYTOLOGY ANCILLARY ONLY
Chlamydia: NEGATIVE
Neisseria Gonorrhea: NEGATIVE

## 2014-07-16 LAB — HIV-1 RNA QUANT-NO REFLEX-BLD
HIV 1 RNA Quant: 34 copies/mL — ABNORMAL HIGH (ref <20)
HIV-1 RNA Quant, Log: 1.53 {Log} — ABNORMAL HIGH (ref <1.30)

## 2014-07-16 LAB — RPR

## 2014-08-27 ENCOUNTER — Telehealth: Payer: Self-pay | Admitting: *Deleted

## 2014-08-27 NOTE — Telephone Encounter (Signed)
Appt reminder, and pt needs a PAP smear appt.

## 2014-08-29 ENCOUNTER — Ambulatory Visit (INDEPENDENT_AMBULATORY_CARE_PROVIDER_SITE_OTHER): Payer: BLUE CROSS/BLUE SHIELD | Admitting: Internal Medicine

## 2014-08-29 ENCOUNTER — Encounter: Payer: Self-pay | Admitting: Internal Medicine

## 2014-08-29 VITALS — BP 106/74 | HR 87 | Temp 98.2°F | Ht 63.0 in | Wt 213.0 lb

## 2014-08-29 DIAGNOSIS — E0865 Diabetes mellitus due to underlying condition with hyperglycemia: Secondary | ICD-10-CM

## 2014-08-29 DIAGNOSIS — B20 Human immunodeficiency virus [HIV] disease: Secondary | ICD-10-CM | POA: Diagnosis not present

## 2014-08-29 MED ORDER — ELVITEG-COBIC-EMTRICIT-TENOFDF 150-150-200-300 MG PO TABS: ORAL_TABLET | ORAL | Status: DC

## 2014-08-29 NOTE — Assessment & Plan Note (Signed)
I discussed how she needs to get on a regimen for her diabetes and she is going to call her endocrinologist to make an appt.  This is likely the cause of her fatigue.

## 2014-08-29 NOTE — Assessment & Plan Note (Signed)
She is doing well with this. RTC 6 months.

## 2014-08-29 NOTE — Progress Notes (Signed)
  Subjective:    Patient ID: Melissa Cruz, female    DOB: [birth year], 34 y.o.   MRN: [id number]  HPI She comes in here for followup of her HIV. She is on Stribild for her HIV and has good tolerance and excellent compliance with no missed doses. Her CD4 count is 610 with just 34 copies on viral load. She feels well and is pleased with the regimen. She has had no rash, diarrhea or other side effects including no nausea with the medication.  For her diabetes, she continues to not take metformin or go to her endocrinologist or to a PCP.      Review of Systems  Constitutional: Positive for fatigue. Negative for appetite change.  HENT: Negative for sore throat and trouble swallowing.   Eyes: Negative for visual disturbance.  Gastrointestinal: Negative for nausea and diarrhea.  Musculoskeletal: Negative for myalgias and arthralgias.  Skin: Negative for rash.  Neurological: Negative for dizziness, light-headedness and headaches.       Objective:   Physical Exam  Constitutional: She appears well-developed and well-nourished. No distress.  HENT:  Mouth/Throat: No oropharyngeal exudate.  Eyes: No scleral icterus.  Cardiovascular: Normal rate, regular rhythm and normal heart sounds.  Exam reveals no gallop and no friction rub.   No murmur heard. Pulmonary/Chest: Effort normal and breath sounds normal. No respiratory distress. She has no wheezes.  Lymphadenopathy:    She has no cervical adenopathy.  Skin: No rash noted.          Assessment & Plan:

## 2014-11-01 ENCOUNTER — Telehealth: Payer: Self-pay | Admitting: *Deleted

## 2014-11-01 NOTE — Telephone Encounter (Signed)
RN contacted the patient and left a message stating "my name and that I am f/u to see how the patient is doing and if I can offer any additional services." RN left a return number for the patient. Purpose of communication is to offer the patient assistance with any identified needs with a goal of medication adherence

## 2015-05-27 ENCOUNTER — Other Ambulatory Visit: Payer: PRIVATE HEALTH INSURANCE

## 2015-05-27 DIAGNOSIS — B20 Human immunodeficiency virus [HIV] disease: Secondary | ICD-10-CM

## 2015-05-28 LAB — T-HELPER CELL (CD4) - (RCID CLINIC ONLY)
CD4 % Helper T Cell: 21 % — ABNORMAL LOW (ref 33–55)
CD4 T CELL ABS: 660 /uL (ref 400–2700)

## 2015-05-28 LAB — HIV-1 RNA QUANT-NO REFLEX-BLD

## 2015-06-10 ENCOUNTER — Encounter: Payer: Self-pay | Admitting: Internal Medicine

## 2015-06-10 ENCOUNTER — Ambulatory Visit (INDEPENDENT_AMBULATORY_CARE_PROVIDER_SITE_OTHER): Payer: PRIVATE HEALTH INSURANCE | Admitting: Internal Medicine

## 2015-06-10 VITALS — BP 116/80 | HR 90 | Temp 98.1°F | Ht 63.0 in | Wt 210.0 lb

## 2015-06-10 DIAGNOSIS — Z79899 Other long term (current) drug therapy: Secondary | ICD-10-CM | POA: Diagnosis not present

## 2015-06-10 DIAGNOSIS — F17200 Nicotine dependence, unspecified, uncomplicated: Secondary | ICD-10-CM

## 2015-06-10 DIAGNOSIS — B20 Human immunodeficiency virus [HIV] disease: Secondary | ICD-10-CM

## 2015-06-10 DIAGNOSIS — Z113 Encounter for screening for infections with a predominantly sexual mode of transmission: Secondary | ICD-10-CM | POA: Diagnosis not present

## 2015-06-10 NOTE — Assessment & Plan Note (Signed)
Discussed cessation.  She is precontemplative at this time

## 2015-06-10 NOTE — Progress Notes (Signed)
  Subjective:    Patient ID: Melissa Cruz, female    DOB: Sep 21, 1980, [age] y.o.   MRN: [id number]  HPI She comes in here for followup of her HIV.  She is on Stribild for her HIV and has good tolerance and excellent compliance with no missed doses. Her CD4 count is 660 and viral load is undetectable. She feels well and is pleased with the regimen. She has had no rash, diarrhea or other side effects including no nausea with the medication.  For her diabetes, she continues to not take metformin or go to her endocrinologist or to a PCP despite my consistent recommendations.      Review of Systems  Constitutional: Positive for fatigue. Negative for appetite change.  HENT: Negative for sore throat and trouble swallowing.   Eyes: Negative for visual disturbance.  Gastrointestinal: Negative for nausea and diarrhea.  Musculoskeletal: Negative for myalgias and arthralgias.  Skin: Negative for rash.  Neurological: Negative for dizziness, light-headedness and headaches.       Objective:   Physical Exam  Constitutional: She appears well-developed and well-nourished. No distress.  HENT:  Mouth/Throat: No oropharyngeal exudate.  Eyes: No scleral icterus.  Cardiovascular: Normal rate, regular rhythm and normal heart sounds.  Exam reveals no gallop and no friction rub.   No murmur heard. Pulmonary/Chest: Effort normal and breath sounds normal. No respiratory distress. She has no wheezes.  Lymphadenopathy:    She has no cervical adenopathy.  Skin: No rash noted.   SHX: continues to smoke       Assessment & Plan:

## 2015-06-10 NOTE — Assessment & Plan Note (Signed)
Doing great on her HIV meds.  RTC 6 months with labs before.

## 2015-06-10 NOTE — Assessment & Plan Note (Signed)
I again told her that she needs therapy for diabetes.  She continues to not want metformin so needs to see a PCP or endocrinologist for management.

## 2015-06-13 ENCOUNTER — Ambulatory Visit (INDEPENDENT_AMBULATORY_CARE_PROVIDER_SITE_OTHER): Payer: PRIVATE HEALTH INSURANCE | Admitting: *Deleted

## 2015-06-13 DIAGNOSIS — B373 Candidiasis of vulva and vagina: Secondary | ICD-10-CM | POA: Diagnosis not present

## 2015-06-13 DIAGNOSIS — Z124 Encounter for screening for malignant neoplasm of cervix: Secondary | ICD-10-CM

## 2015-06-13 DIAGNOSIS — Z113 Encounter for screening for infections with a predominantly sexual mode of transmission: Secondary | ICD-10-CM | POA: Diagnosis not present

## 2015-06-13 DIAGNOSIS — B3731 Acute candidiasis of vulva and vagina: Secondary | ICD-10-CM

## 2015-06-13 MED ORDER — FLUCONAZOLE 150 MG PO TABS: 150.0000 mg | ORAL_TABLET | Freq: Once | ORAL | Status: DC | PRN

## 2015-06-13 NOTE — Patient Instructions (Signed)
Your results will be ready in about a week.  I will mail them to you.  Thank you for coming to the Center for your care.  Zylie Mumaw,  RN 

## 2015-06-13 NOTE — Progress Notes (Signed)
  Subjective:     Melissa Cruz is a 35 y.o. woman who comes in today for a  pap smear only. Previous abnormal Pap smears: yes. Contraception: tubal ligation/condoms.  Pt c/o yeast infection after menses.  Pt is requesting a PRN rx for fluconazole.  Received verbal order from Dr. Luciana Axeomer for Fluconazole 150 MG take one tablet as needed for yeast infection 5 refills.  Objective:    LMP 05/26/2015 (Approximate) Pelvic Exam: . Pap smear obtained.   Assessment:    Screening pap smear.   Plan:    Follow up in one year, or as indicated by Pap results.  Pt given educational materials re: HIV and women, self-esteem, BSE, nutrition and diet management, PAP smears and partner safety. Pt given condoms

## 2015-06-17 LAB — CYTOLOGY - PAP

## 2015-06-17 LAB — CERVICOVAGINAL ANCILLARY ONLY
CHLAMYDIA, DNA PROBE: NEGATIVE
NEISSERIA GONORRHEA: NEGATIVE

## 2015-07-04 ENCOUNTER — Encounter: Payer: Self-pay | Admitting: *Deleted

## 2015-09-10 ENCOUNTER — Other Ambulatory Visit: Payer: Self-pay | Admitting: Internal Medicine

## 2016-02-17 ENCOUNTER — Other Ambulatory Visit (HOSPITAL_COMMUNITY)
Admission: RE | Admit: 2016-02-17 | Discharge: 2016-02-17 | Disposition: A | Discharge status: Home / Self Care | Payer: Managed Care, Other (non HMO) | Source: Ambulatory Visit | Attending: Internal Medicine | Admitting: Internal Medicine

## 2016-02-17 ENCOUNTER — Other Ambulatory Visit: Payer: Managed Care, Other (non HMO)

## 2016-02-17 DIAGNOSIS — Z79899 Other long term (current) drug therapy: Secondary | ICD-10-CM

## 2016-02-17 DIAGNOSIS — Z113 Encounter for screening for infections with a predominantly sexual mode of transmission: Secondary | ICD-10-CM | POA: Diagnosis present

## 2016-02-17 DIAGNOSIS — B20 Human immunodeficiency virus [HIV] disease: Secondary | ICD-10-CM

## 2016-02-17 LAB — CBC WITH DIFFERENTIAL/PLATELET
BASOS ABS: 0 {cells}/uL (ref 0–200)
Basophils Relative: 0 %
EOS ABS: 172 {cells}/uL (ref 15–500)
EOS PCT: 2 %
HCT: 36.7 % (ref 35.0–45.0)
HEMOGLOBIN: 11.9 g/dL (ref 11.7–15.5)
Lymphocytes Relative: 32 %
Lymphs Abs: 2752 cells/uL (ref 850–3900)
MCH: 25.4 pg — AB (ref 27.0–33.0)
MCHC: 32.4 g/dL (ref 32.0–36.0)
MCV: 78.4 fL — AB (ref 80.0–100.0)
MONOS PCT: 7 %
MPV: 9.4 fL (ref 7.5–12.5)
Monocytes Absolute: 602 cells/uL (ref 200–950)
NEUTROS PCT: 59 %
Neutro Abs: 5074 cells/uL (ref 1500–7800)
PLATELETS: 426 10*3/uL — AB (ref 140–400)
RBC: 4.68 MIL/uL (ref 3.80–5.10)
RDW: 15.9 % — ABNORMAL HIGH (ref 11.0–15.0)
WBC: 8.6 10*3/uL (ref 3.8–10.8)

## 2016-02-17 LAB — COMPLETE METABOLIC PANEL WITH GFR
ALBUMIN: 4 g/dL (ref 3.6–5.1)
ALK PHOS: 84 U/L (ref 33–115)
ALT: 12 U/L (ref 6–29)
AST: 12 U/L (ref 10–30)
BILIRUBIN TOTAL: 0.3 mg/dL (ref 0.2–1.2)
BUN: 6 mg/dL — AB (ref 7–25)
CO2: 24 mmol/L (ref 20–31)
CREATININE: 0.54 mg/dL (ref 0.50–1.10)
Calcium: 9.2 mg/dL (ref 8.6–10.2)
Chloride: 101 mmol/L (ref 98–110)
GFR, Est African American: >89 mL/min (ref >=60)
GFR, Est Non African American: >89 mL/min (ref >=60)
GLUCOSE: 259 mg/dL — AB (ref 65–99)
Potassium: 4.2 mmol/L (ref 3.5–5.3)
SODIUM: 134 mmol/L — AB (ref 135–146)
TOTAL PROTEIN: 6.8 g/dL (ref 6.1–8.1)

## 2016-02-17 LAB — LIPID PANEL
Cholesterol: 185 mg/dL (ref <200)
HDL: 35 mg/dL — AB (ref >50)
LDL CALC: 107 mg/dL — AB (ref <100)
Total CHOL/HDL Ratio: 5.3 Ratio — ABNORMAL HIGH (ref <5.0)
Triglycerides: 215 mg/dL — ABNORMAL HIGH (ref <150)
VLDL: 43 mg/dL — AB (ref <30)

## 2016-02-18 LAB — RPR

## 2016-02-18 LAB — URINE CYTOLOGY ANCILLARY ONLY
CHLAMYDIA, DNA PROBE: NEGATIVE
NEISSERIA GONORRHEA: NEGATIVE

## 2016-02-18 LAB — T-HELPER CELL (CD4) - (RCID CLINIC ONLY)
CD4 % Helper T Cell: 23 % — ABNORMAL LOW (ref 33–55)
CD4 T Cell Abs: 660 /uL (ref 400–2700)

## 2016-02-19 LAB — HIV-1 RNA QUANT-NO REFLEX-BLD: HIV-1 RNA Quant, Log: <1.3 Log copies/mL (ref <1.30)

## 2016-03-02 ENCOUNTER — Encounter: Payer: Self-pay | Admitting: Internal Medicine

## 2016-03-02 ENCOUNTER — Ambulatory Visit (INDEPENDENT_AMBULATORY_CARE_PROVIDER_SITE_OTHER): Payer: Managed Care, Other (non HMO) | Admitting: Internal Medicine

## 2016-03-02 VITALS — BP 121/86 | HR 92 | Temp 98.2°F | Wt 217.0 lb

## 2016-03-02 DIAGNOSIS — B373 Candidiasis of vulva and vagina: Secondary | ICD-10-CM

## 2016-03-02 DIAGNOSIS — B20 Human immunodeficiency virus [HIV] disease: Secondary | ICD-10-CM

## 2016-03-02 DIAGNOSIS — B3731 Acute candidiasis of vulva and vagina: Secondary | ICD-10-CM

## 2016-03-02 DIAGNOSIS — Z79899 Other long term (current) drug therapy: Secondary | ICD-10-CM | POA: Diagnosis not present

## 2016-03-02 DIAGNOSIS — E0800 Diabetes mellitus due to underlying condition with hyperosmolarity without nonketotic hyperglycemic-hyperosmolar coma (NKHHC): Secondary | ICD-10-CM

## 2016-03-02 MED ORDER — FLUCONAZOLE 150 MG PO TABS: 150.0000 mg | ORAL_TABLET | Freq: Once | ORAL | 11 refills | Status: DC | PRN

## 2016-03-02 MED ORDER — ELVITEG-COBIC-EMTRICIT-TENOFAF 150-150-200-10 MG PO TABS: 1.0000 | ORAL_TABLET | Freq: Every day | ORAL | 5 refills | Status: DC

## 2016-03-02 MED ORDER — BUPROPION HCL ER (SR) 150 MG PO TB12: 150.0000 mg | ORAL_TABLET | Freq: Two times a day (BID) | ORAL | 2 refills | Status: DC

## 2016-03-03 DIAGNOSIS — Z79899 Other long term (current) drug therapy: Secondary | ICD-10-CM | POA: Insufficient documentation

## 2016-03-03 NOTE — Assessment & Plan Note (Addendum)
Doing well with this.  rtc 6 months. [facility]

## 2016-03-03 NOTE — Assessment & Plan Note (Signed)
I again emphasized she needs to establish with a pcp.

## 2016-03-03 NOTE — Progress Notes (Signed)
  Subjective:    Patient ID: Melissa Cruz, female    DOB: [birth year], [age] y.o.   MRN: [id number]  HPI She comes in here for followup of her HIV.  She is on Stribild for her HIV and has good tolerance and excellent compliance with no missed doses. Her CD4 count is 660 and viral load is undetectable. She feels well and is pleased with the regimen. She has had no rash, diarrhea or other side effects including no nausea with the medication.  Getting divorced from her husband.    For her diabetes, she continues to not take metformin or go to her endocrinologist or to a PCP despite my consistent recommendations.      Review of Systems  Constitutional: Positive for fatigue. Negative for appetite change.  HENT: Negative for sore throat and trouble swallowing.   Eyes: Negative for visual disturbance.  Gastrointestinal: Negative for diarrhea and nausea.  Musculoskeletal: Negative for arthralgias and myalgias.  Skin: Negative for rash.  Neurological: Negative for dizziness, light-headedness and headaches.       Objective:   Physical Exam  Constitutional: She appears well-developed and well-nourished. No distress.  HENT:  Mouth/Throat: No oropharyngeal exudate.  Eyes: No scleral icterus.  Cardiovascular: Normal rate, regular rhythm and normal heart sounds.  Exam reveals no gallop and no friction rub.   No murmur heard. Pulmonary/Chest: Effort normal and breath sounds normal. No respiratory distress. She has no wheezes.  Lymphadenopathy:    She has no cervical adenopathy.  Skin: No rash noted.   SHX: continues to smoke       Assessment & Plan:

## 2016-03-03 NOTE — Assessment & Plan Note (Signed)
Creat stable.   

## 2016-06-11 ENCOUNTER — Encounter (HOSPITAL_COMMUNITY): Payer: Self-pay | Admitting: *Deleted

## 2016-06-16 ENCOUNTER — Other Ambulatory Visit: Payer: Self-pay | Admitting: Obstetrics & Gynecology

## 2016-06-17 ENCOUNTER — Encounter (HOSPITAL_COMMUNITY): Payer: Self-pay

## 2016-06-17 ENCOUNTER — Encounter (HOSPITAL_COMMUNITY)
Admission: RE | Admit: 2016-06-17 | Discharge: 2016-06-17 | Disposition: A | Discharge status: Home / Self Care | Payer: Managed Care, Other (non HMO) | Source: Ambulatory Visit | Attending: Obstetrics & Gynecology | Admitting: Obstetrics & Gynecology

## 2016-06-17 DIAGNOSIS — N921 Excessive and frequent menstruation with irregular cycle: Secondary | ICD-10-CM | POA: Diagnosis not present

## 2016-06-17 HISTORY — DX: Anemia, unspecified: D64.9

## 2016-06-17 LAB — CBC
HCT: 36.3 % (ref 36.0–46.0)
HEMOGLOBIN: 11.8 g/dL — AB (ref 12.0–15.0)
MCH: 25.6 pg — ABNORMAL LOW (ref 26.0–34.0)
MCHC: 32.5 g/dL (ref 30.0–36.0)
MCV: 78.7 fL (ref 78.0–100.0)
Platelets: 359 10*3/uL (ref 150–400)
RBC: 4.61 MIL/uL (ref 3.87–5.11)
RDW: 14.7 % (ref 11.5–15.5)
WBC: 8.7 10*3/uL (ref 4.0–10.5)

## 2016-06-17 NOTE — Patient Instructions (Addendum)
Your procedure is scheduled on: Monday June 29, 2015 at [facility]1pm  [facility] through [facility] at: 11:30  Pick up the phone at the desk and dial [phone].  Call this number if you have problems the morning of surgery: [phone].  Remember: Do NOT eat food: after Midnight on Sunday March 25 Do NOT drink clear liquids after: 9 am Take these medicines the morning of surgery with a SIP OF WATER: Genvoya  Do NOT wear jewelry (body piercing), metal hair clips/bobby pins, make-up, or nail polish. Do NOT wear lotions, powders, or perfumes.  You may wear deoderant. Do NOT shave for 48 hours prior to surgery. Do NOT bring valuables to the hospital. Contacts, dentures, or bridgework may not be worn into surgery.  Have a responsible adult drive you home and stay with you for 24 hours after your procedure

## 2016-06-28 ENCOUNTER — Encounter (HOSPITAL_COMMUNITY)
Admission: RE | Disposition: A | Discharge status: Home / Self Care | Payer: Self-pay | Source: Ambulatory Visit | Attending: Obstetrics & Gynecology

## 2016-06-28 ENCOUNTER — Ambulatory Visit (HOSPITAL_COMMUNITY): Payer: Managed Care, Other (non HMO) | Admitting: Anesthesiology

## 2016-06-28 ENCOUNTER — Encounter (HOSPITAL_COMMUNITY): Payer: Self-pay

## 2016-06-28 ENCOUNTER — Ambulatory Visit (HOSPITAL_COMMUNITY)
Admission: RE | Admit: 2016-06-28 | Discharge: 2016-06-28 | Disposition: A | Discharge status: Home / Self Care | Payer: Managed Care, Other (non HMO) | Source: Ambulatory Visit | Attending: Obstetrics & Gynecology | Admitting: Obstetrics & Gynecology

## 2016-06-28 DIAGNOSIS — N920 Excessive and frequent menstruation with regular cycle: Secondary | ICD-10-CM | POA: Insufficient documentation

## 2016-06-28 DIAGNOSIS — E119 Type 2 diabetes mellitus without complications: Secondary | ICD-10-CM | POA: Diagnosis not present

## 2016-06-28 DIAGNOSIS — Z5309 Procedure and treatment not carried out because of other contraindication: Secondary | ICD-10-CM | POA: Diagnosis not present

## 2016-06-28 LAB — GLUCOSE, CAPILLARY: GLUCOSE-CAPILLARY: 340 mg/dL — AB (ref 65–99)

## 2016-06-28 SURGERY — DILATATION & CURETTAGE/HYSTEROSCOPY WITH NOVASURE ABLATION
Anesthesia: Choice

## 2016-06-28 MED ORDER — SCOPOLAMINE 1 MG/3DAYS TD PT72
1.0000 | MEDICATED_PATCH | Freq: Once | TRANSDERMAL | Status: DC
  Administered 2016-06-28: 1.5 mg via TRANSDERMAL

## 2016-06-28 MED ORDER — SCOPOLAMINE 1 MG/3DAYS TD PT72
MEDICATED_PATCH | TRANSDERMAL | Status: AC
  Administered 2016-06-28: 1.5 mg via TRANSDERMAL
  Filled 2016-06-28: qty 1

## 2016-06-28 MED ORDER — CEFAZOLIN SODIUM-DEXTROSE 2-4 GM/100ML-% IV SOLN: 2.0000 g | INTRAVENOUS | Status: DC

## 2016-06-28 MED ORDER — LACTATED RINGERS IV SOLN: INTRAVENOUS | Status: DC

## 2016-06-28 NOTE — OR Nursing (Signed)
Pt here for elective procedure c Dr. Seymour BarsLavoie.  CBG 340mg /dL and under no medical treatment for diabetes at this time.  Cancelled per Dr Renold DonGermeroth c Anesthesia.  Pt instructed to find a PCP/Endocriniolgist and follow-up c Dr Seymour BarsLavoie to reschedule her procedure.

## 2016-06-28 NOTE — Progress Notes (Signed)
Anesthesia Eval Presents for elective D and C with ablation. Pt has long history of untreated diabetes with blood glc 343 today and A1c 7.8 04/2012 and 9.0 08/2012. It has not been checked since. She is seen by ID for her HIV, Dr. Luciana Axeomer, who stated in his last note on 03/01/2016 "I again emphasized she needs to establish with a pcp". I spoke with Dr. Seymour BarsLavoie and she said she would have her office refer her to endocrinology. I spoke to the patient again and said that she must establish primary care to control her diabetes prior to having elective surgery. She was very upset and did not express any desire to hear my recommendations and began disrobing while I was trying to explain my concerns. I recommended that she establish with a PCP and contact Dr. Sharol RousselLavoie's office to reschedule once her diabetes is better controlled.

## 2016-07-16 ENCOUNTER — Other Ambulatory Visit: Payer: Self-pay | Admitting: Internal Medicine

## 2016-07-16 DIAGNOSIS — B373 Candidiasis of vulva and vagina: Secondary | ICD-10-CM

## 2016-07-16 DIAGNOSIS — B3731 Acute candidiasis of vulva and vagina: Secondary | ICD-10-CM

## 2016-09-07 ENCOUNTER — Other Ambulatory Visit: Payer: Self-pay | Admitting: Internal Medicine

## 2016-09-07 DIAGNOSIS — B20 Human immunodeficiency virus [HIV] disease: Secondary | ICD-10-CM

## 2016-10-08 ENCOUNTER — Telehealth: Payer: Self-pay | Admitting: *Deleted

## 2016-10-08 ENCOUNTER — Other Ambulatory Visit: Payer: Self-pay | Admitting: Internal Medicine

## 2016-10-08 DIAGNOSIS — B373 Candidiasis of vulva and vagina: Secondary | ICD-10-CM

## 2016-10-08 DIAGNOSIS — B3731 Acute candidiasis of vulva and vagina: Secondary | ICD-10-CM

## 2016-10-08 MED ORDER — FLUCONAZOLE 150 MG PO TABS: ORAL_TABLET | ORAL | 0 refills | Status: DC

## 2016-10-08 NOTE — Telephone Encounter (Signed)
Patient has appointments for labs and MD scheduled.

## 2016-10-12 ENCOUNTER — Other Ambulatory Visit: Payer: Self-pay | Admitting: Internal Medicine

## 2016-10-12 DIAGNOSIS — B20 Human immunodeficiency virus [HIV] disease: Secondary | ICD-10-CM

## 2016-10-28 ENCOUNTER — Other Ambulatory Visit: Payer: Self-pay | Admitting: Internal Medicine

## 2016-10-28 DIAGNOSIS — B20 Human immunodeficiency virus [HIV] disease: Secondary | ICD-10-CM

## 2016-11-09 ENCOUNTER — Other Ambulatory Visit: Payer: Managed Care, Other (non HMO)

## 2016-11-09 DIAGNOSIS — B20 Human immunodeficiency virus [HIV] disease: Secondary | ICD-10-CM

## 2016-11-09 DIAGNOSIS — E119 Type 2 diabetes mellitus without complications: Secondary | ICD-10-CM

## 2016-11-10 LAB — T-HELPER CELL (CD4) - (RCID CLINIC ONLY)
CD4 % Helper T Cell: 24 % — ABNORMAL LOW (ref 33–55)
CD4 T Cell Abs: 740 /uL (ref 400–2700)

## 2016-11-10 LAB — HEMOGLOBIN A1C
Hgb A1c MFr Bld: 8.4 % — ABNORMAL HIGH (ref <5.7)
MEAN PLASMA GLUCOSE: 194 mg/dL

## 2016-11-11 LAB — HIV-1 RNA QUANT-NO REFLEX-BLD
HIV 1 RNA QUANT: DETECTED {copies}/mL — AB
HIV-1 RNA QUANT, LOG: DETECTED {Log_copies}/mL — AB

## 2016-11-23 ENCOUNTER — Ambulatory Visit (INDEPENDENT_AMBULATORY_CARE_PROVIDER_SITE_OTHER): Payer: Managed Care, Other (non HMO) | Admitting: Internal Medicine

## 2016-11-23 ENCOUNTER — Encounter: Payer: Self-pay | Admitting: Internal Medicine

## 2016-11-23 VITALS — BP 113/76 | HR 91 | Temp 98.2°F | Ht 63.0 in | Wt 214.0 lb

## 2016-11-23 DIAGNOSIS — Z113 Encounter for screening for infections with a predominantly sexual mode of transmission: Secondary | ICD-10-CM

## 2016-11-23 DIAGNOSIS — G63 Polyneuropathy in diseases classified elsewhere: Secondary | ICD-10-CM

## 2016-11-23 DIAGNOSIS — F17200 Nicotine dependence, unspecified, uncomplicated: Secondary | ICD-10-CM | POA: Diagnosis not present

## 2016-11-23 DIAGNOSIS — Z79899 Other long term (current) drug therapy: Secondary | ICD-10-CM

## 2016-11-23 DIAGNOSIS — G629 Polyneuropathy, unspecified: Secondary | ICD-10-CM | POA: Insufficient documentation

## 2016-11-23 DIAGNOSIS — B20 Human immunodeficiency virus [HIV] disease: Secondary | ICD-10-CM | POA: Diagnosis not present

## 2016-11-23 MED ORDER — GABAPENTIN 100 MG PO CAPS: 300.0000 mg | ORAL_CAPSULE | Freq: Every day | ORAL | 5 refills | Status: DC

## 2016-11-23 MED ORDER — GABAPENTIN 100 MG PO CAPS: 100.0000 mg | ORAL_CAPSULE | Freq: Three times a day (TID) | ORAL | 5 refills | Status: DC

## 2016-11-23 NOTE — Assessment & Plan Note (Signed)
Will try gabapentin 

## 2016-11-23 NOTE — Assessment & Plan Note (Signed)
I encouraged cessation 

## 2016-11-23 NOTE — Progress Notes (Signed)
   Subjective:    Patient ID: Melissa Cruz, female    DOB: 10/24/1980, 36 y.o.   MRN: [id number]  HPI Here for follow up of HIV Continues on Genvoya and denies any missed doses.  No issues with medication and no associated n/v/d.  Since last visit she has not estblished with an endocrinologist for her diabetes.  No PCP.  Now on insulin.  Her last A1c was 13.  Feels better.  Son in college and other one finishing high school this year.  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     Review of Systems  Constitutional: Negative for fatigue.  Gastrointestinal: Negative for diarrhea.  Skin: Negative for rash.  Neurological: Negative for dizziness.       Objective:   Physical Exam  Constitutional: She appears well-developed and well-nourished. No distress.  HENT:  Mouth/Throat: No oropharyngeal exudate.  Eyes: No scleral icterus.  Cardiovascular: Normal rate, regular rhythm and normal heart sounds.   No murmur heard. Pulmonary/Chest: Breath sounds normal. No respiratory distress. She has no wheezes.  Lymphadenopathy:    She has no cervical adenopathy.  Skin: No rash noted.   SH: continues to smoke, trying to quit      Assessment & Plan:

## 2016-11-23 NOTE — Assessment & Plan Note (Signed)
Doing well. Continue with Genvoya.  rtc 6 months.

## 2016-12-03 ENCOUNTER — Other Ambulatory Visit: Payer: Self-pay | Admitting: Internal Medicine

## 2016-12-03 DIAGNOSIS — B20 Human immunodeficiency virus [HIV] disease: Secondary | ICD-10-CM

## 2017-03-15 ENCOUNTER — Telehealth: Payer: Self-pay | Admitting: *Deleted

## 2017-03-15 NOTE — Telephone Encounter (Signed)
I am honestly not sure how those two were sent.  It may be something she was already taking.  I would just do 300 mg three times a day, so 270 of the 300 mg pills for 90 days.  You may check with her first how she is taking it.  thanks

## 2017-03-15 NOTE — Telephone Encounter (Signed)
Patient asking for 90 day prescription of gabapentin. Please advise on dosing - has 2 prescriptions in chart.  Andree CossHowell, Arita Severtson M, RN

## 2017-03-16 NOTE — Telephone Encounter (Signed)
Patient currently taking 100 mg twice daily, 400 mg at bedtime.  She would like to go ahead and try the 300mg  three times daily. Rx sent for that. She is asking for a refill of diflucan. She states she gets yeast infections monthly after her cycle. Please advise.

## 2017-03-16 NOTE — Telephone Encounter (Signed)
She should see GYN for that.  That is not typical to have that many yeast infections, there may be an issue. thanks

## 2017-03-17 ENCOUNTER — Other Ambulatory Visit: Payer: Self-pay | Admitting: *Deleted

## 2017-03-17 DIAGNOSIS — G63 Polyneuropathy in diseases classified elsewhere: Secondary | ICD-10-CM

## 2017-03-17 MED ORDER — GABAPENTIN 100 MG PO CAPS: 300.0000 mg | ORAL_CAPSULE | Freq: Three times a day (TID) | ORAL | 5 refills | Status: DC

## 2017-03-17 NOTE — Telephone Encounter (Signed)
Notified patient, she will follow up with gynecology.

## 2017-04-06 ENCOUNTER — Other Ambulatory Visit: Payer: Self-pay | Admitting: Internal Medicine

## 2017-04-06 DIAGNOSIS — G63 Polyneuropathy in diseases classified elsewhere: Secondary | ICD-10-CM

## 2017-04-07 ENCOUNTER — Other Ambulatory Visit: Payer: Self-pay | Admitting: *Deleted

## 2017-04-07 DIAGNOSIS — G63 Polyneuropathy in diseases classified elsewhere: Secondary | ICD-10-CM

## 2017-04-07 MED ORDER — GABAPENTIN 300 MG PO CAPS: 300.0000 mg | ORAL_CAPSULE | Freq: Three times a day (TID) | ORAL | 5 refills | Status: DC

## 2017-04-07 NOTE — Progress Notes (Signed)
Original prescription written for 10 day supply. Prescription corrected. Andree CossHowell, Xabi Wittler M, RN

## 2017-04-27 ENCOUNTER — Other Ambulatory Visit: Payer: Self-pay | Admitting: Internal Medicine

## 2017-04-27 DIAGNOSIS — B20 Human immunodeficiency virus [HIV] disease: Secondary | ICD-10-CM

## 2017-07-25 ENCOUNTER — Other Ambulatory Visit: Payer: Self-pay | Admitting: *Deleted

## 2017-07-25 DIAGNOSIS — B20 Human immunodeficiency virus [HIV] disease: Secondary | ICD-10-CM

## 2017-07-25 MED ORDER — ELVITEG-COBIC-EMTRICIT-TENOFAF 150-150-200-10 MG PO TABS: 1.0000 | ORAL_TABLET | Freq: Every day | ORAL | 1 refills | Status: DC

## 2017-07-26 ENCOUNTER — Other Ambulatory Visit: Payer: Self-pay | Admitting: *Deleted

## 2017-07-26 DIAGNOSIS — B20 Human immunodeficiency virus [HIV] disease: Secondary | ICD-10-CM

## 2017-07-26 MED ORDER — ELVITEG-COBIC-EMTRICIT-TENOFAF 150-150-200-10 MG PO TABS: 1.0000 | ORAL_TABLET | Freq: Every day | ORAL | 5 refills | Status: DC

## 2017-08-17 ENCOUNTER — Other Ambulatory Visit: Payer: Self-pay | Admitting: Internal Medicine

## 2017-08-17 DIAGNOSIS — B20 Human immunodeficiency virus [HIV] disease: Secondary | ICD-10-CM

## 2017-09-10 ENCOUNTER — Other Ambulatory Visit: Payer: Self-pay | Admitting: Internal Medicine

## 2017-09-10 DIAGNOSIS — B20 Human immunodeficiency virus [HIV] disease: Secondary | ICD-10-CM

## 2017-09-20 ENCOUNTER — Other Ambulatory Visit: Payer: Self-pay

## 2017-09-20 NOTE — Telephone Encounter (Signed)
Pt called today to inform our office that her pharmacy is requesting a pa decision on Genvoya due to pt having a insurance change. Attempted PA through covermymeds waiting on a response before I inform the pharmacy. PA should respond in 24-72 hours Melissa CourierJose L Obryan Radu, [place]

## 2017-09-23 NOTE — Telephone Encounter (Signed)
Resent 6/21 with additional information, approved. Notified [facility] who requested the PA and patient. Andree CossHowell, Michelle M, RN

## 2017-09-23 NOTE — Telephone Encounter (Signed)
Patient called to check on her PA and after looking on the system advised still pending an as soon as we get the ok we will inform the pharmacy and give her a call as well.

## 2017-09-29 ENCOUNTER — Other Ambulatory Visit: Payer: 59

## 2017-09-29 DIAGNOSIS — Z113 Encounter for screening for infections with a predominantly sexual mode of transmission: Secondary | ICD-10-CM

## 2017-09-29 DIAGNOSIS — B20 Human immunodeficiency virus [HIV] disease: Secondary | ICD-10-CM

## 2017-09-29 DIAGNOSIS — R69 Illness, unspecified: Secondary | ICD-10-CM | POA: Diagnosis not present

## 2017-09-30 LAB — T-HELPER CELL (CD4) - (RCID CLINIC ONLY)
CD4 % Helper T Cell: 24 % — ABNORMAL LOW (ref 33–55)
CD4 T CELL ABS: 790 /uL (ref 400–2700)

## 2017-09-30 LAB — COMPLETE METABOLIC PANEL WITH GFR
AG RATIO: 1.4 (calc) (ref 1.0–2.5)
ALT: 12 U/L (ref 6–29)
AST: 12 U/L (ref 10–30)
Albumin: 4 g/dL (ref 3.6–5.1)
Alkaline phosphatase (APISO): 67 U/L (ref 33–115)
BUN: 7 mg/dL (ref 7–25)
CALCIUM: 9.5 mg/dL (ref 8.6–10.2)
CO2: 24 mmol/L (ref 20–32)
CREATININE: 0.56 mg/dL (ref 0.50–1.10)
Chloride: 103 mmol/L (ref 98–110)
GFR, EST AFRICAN AMERICAN: 136 mL/min/{1.73_m2} (ref >=60)
GFR, EST NON AFRICAN AMERICAN: 117 mL/min/{1.73_m2} (ref >=60)
Globulin: 2.8 g/dL (calc) (ref 1.9–3.7)
Glucose, Bld: 114 mg/dL — ABNORMAL HIGH (ref 65–99)
POTASSIUM: 4 mmol/L (ref 3.5–5.3)
Sodium: 136 mmol/L (ref 135–146)
TOTAL PROTEIN: 6.8 g/dL (ref 6.1–8.1)
Total Bilirubin: 0.4 mg/dL (ref 0.2–1.2)

## 2017-09-30 LAB — RPR: RPR Ser Ql: NONREACTIVE

## 2017-10-01 LAB — HIV-1 RNA QUANT-NO REFLEX-BLD
HIV 1 RNA Quant: <20 copies/mL
HIV-1 RNA Quant, Log: <1.3 Log copies/mL

## 2017-10-13 ENCOUNTER — Encounter: Payer: Self-pay | Admitting: Internal Medicine

## 2017-10-13 ENCOUNTER — Other Ambulatory Visit: Payer: Self-pay

## 2017-10-13 ENCOUNTER — Ambulatory Visit (INDEPENDENT_AMBULATORY_CARE_PROVIDER_SITE_OTHER): Payer: 59 | Admitting: Internal Medicine

## 2017-10-13 VITALS — BP 118/80 | HR 76 | Wt 190.0 lb

## 2017-10-13 DIAGNOSIS — Z113 Encounter for screening for infections with a predominantly sexual mode of transmission: Secondary | ICD-10-CM

## 2017-10-13 DIAGNOSIS — G63 Polyneuropathy in diseases classified elsewhere: Secondary | ICD-10-CM | POA: Diagnosis not present

## 2017-10-13 DIAGNOSIS — Z23 Encounter for immunization: Secondary | ICD-10-CM | POA: Insufficient documentation

## 2017-10-13 DIAGNOSIS — Z79899 Other long term (current) drug therapy: Secondary | ICD-10-CM | POA: Diagnosis not present

## 2017-10-13 DIAGNOSIS — B20 Human immunodeficiency virus [HIV] disease: Secondary | ICD-10-CM

## 2017-10-13 DIAGNOSIS — R69 Illness, unspecified: Secondary | ICD-10-CM | POA: Diagnosis not present

## 2017-10-13 DIAGNOSIS — F17200 Nicotine dependence, unspecified, uncomplicated: Secondary | ICD-10-CM

## 2017-10-13 MED ORDER — GABAPENTIN 300 MG PO CAPS: 300.0000 mg | ORAL_CAPSULE | Freq: Three times a day (TID) | ORAL | 11 refills | Status: DC

## 2017-10-13 MED ORDER — ELVITEG-COBIC-EMTRICIT-TENOFAF 150-150-200-10 MG PO TABS: 1.0000 | ORAL_TABLET | Freq: Every day | ORAL | 11 refills | Status: DC

## 2017-10-13 NOTE — Assessment & Plan Note (Signed)
Counseled on Prevnar and given today Pneumococcal vaccine next visit

## 2017-10-13 NOTE — Assessment & Plan Note (Signed)
Encouraged cessation and given infor for the [place] quitline

## 2017-10-13 NOTE — Progress Notes (Signed)
   Subjective:    Patient ID: Melissa Cruz, female    DOB: 1980-04-14, 37 y.o.   MRN: [id number]  HPI Here for follow up of HIV Has not been seen in 1 year.  Has continued to take ARVs and no missed doses.  Is purposefully losing weight on a keto diet, more energy and blood sugars in better control.  Feels well overall.  CD4 of 790, viral load < 20.  Creat, LFTs ok.  No associated n/v/d.  No rashes.    Review of Systems  Constitutional: Negative for fatigue.  Gastrointestinal: Negative for diarrhea.  Skin: Negative for rash.  Neurological: Negative for dizziness.       Objective:   Physical Exam  Constitutional: She appears well-developed and well-nourished. No distress.  HENT:  Mouth/Throat: No oropharyngeal exudate.  Eyes: No scleral icterus.  Cardiovascular: Normal rate, regular rhythm and normal heart sounds.  No murmur heard. Pulmonary/Chest: Effort normal and breath sounds normal. No respiratory distress.  Skin: No rash noted.   SH: continues to smoke but trying to quit       Assessment & Plan:

## 2017-10-13 NOTE — Assessment & Plan Note (Signed)
Screened negative 

## 2017-10-13 NOTE — Assessment & Plan Note (Addendum)
Doing well.  She can rtc in 1 year Menveo given today

## 2017-10-13 NOTE — Assessment & Plan Note (Signed)
Labs good with creat, LFTs.

## 2017-10-13 NOTE — Assessment & Plan Note (Signed)
Refilled gabapentin

## 2017-11-04 DIAGNOSIS — Z6833 Body mass index (BMI) 33.0-33.9, adult: Secondary | ICD-10-CM | POA: Diagnosis not present

## 2017-11-04 DIAGNOSIS — Z01419 Encounter for gynecological examination (general) (routine) without abnormal findings: Secondary | ICD-10-CM | POA: Diagnosis not present

## 2017-11-04 DIAGNOSIS — R69 Illness, unspecified: Secondary | ICD-10-CM | POA: Diagnosis not present

## 2017-11-04 DIAGNOSIS — Z118 Encounter for screening for other infectious and parasitic diseases: Secondary | ICD-10-CM | POA: Diagnosis not present

## 2017-11-05 ENCOUNTER — Other Ambulatory Visit: Payer: Self-pay | Admitting: Internal Medicine

## 2017-11-05 DIAGNOSIS — G63 Polyneuropathy in diseases classified elsewhere: Secondary | ICD-10-CM

## 2018-02-05 DIAGNOSIS — Z23 Encounter for immunization: Secondary | ICD-10-CM | POA: Diagnosis not present

## 2018-02-16 DIAGNOSIS — B373 Candidiasis of vulva and vagina: Secondary | ICD-10-CM | POA: Diagnosis not present

## 2018-02-16 DIAGNOSIS — Z8619 Personal history of other infectious and parasitic diseases: Secondary | ICD-10-CM | POA: Diagnosis not present

## 2018-04-08 DIAGNOSIS — J340 Abscess, furuncle and carbuncle of nose: Secondary | ICD-10-CM | POA: Diagnosis not present

## 2018-06-21 DIAGNOSIS — S93601A Unspecified sprain of right foot, initial encounter: Secondary | ICD-10-CM | POA: Diagnosis not present

## 2018-06-30 DIAGNOSIS — L0291 Cutaneous abscess, unspecified: Secondary | ICD-10-CM | POA: Diagnosis not present

## 2018-08-11 ENCOUNTER — Other Ambulatory Visit: Payer: Self-pay | Admitting: Internal Medicine

## 2018-08-11 DIAGNOSIS — G63 Polyneuropathy in diseases classified elsewhere: Secondary | ICD-10-CM

## 2018-09-05 ENCOUNTER — Other Ambulatory Visit: Payer: Self-pay | Admitting: Internal Medicine

## 2018-09-05 DIAGNOSIS — G63 Polyneuropathy in diseases classified elsewhere: Secondary | ICD-10-CM

## 2018-09-07 ENCOUNTER — Other Ambulatory Visit: Payer: Self-pay | Admitting: Internal Medicine

## 2018-09-07 DIAGNOSIS — B20 Human immunodeficiency virus [HIV] disease: Secondary | ICD-10-CM

## 2018-09-10 ENCOUNTER — Other Ambulatory Visit: Payer: Self-pay | Admitting: Internal Medicine

## 2018-09-10 DIAGNOSIS — G63 Polyneuropathy in diseases classified elsewhere: Secondary | ICD-10-CM

## 2018-09-18 DIAGNOSIS — H0288A Meibomian gland dysfunction right eye, upper and lower eyelids: Secondary | ICD-10-CM | POA: Diagnosis not present

## 2018-09-18 DIAGNOSIS — E119 Type 2 diabetes mellitus without complications: Secondary | ICD-10-CM | POA: Diagnosis not present

## 2018-09-18 DIAGNOSIS — H0288B Meibomian gland dysfunction left eye, upper and lower eyelids: Secondary | ICD-10-CM | POA: Diagnosis not present

## 2018-09-18 DIAGNOSIS — H1045 Other chronic allergic conjunctivitis: Secondary | ICD-10-CM | POA: Diagnosis not present

## 2018-09-18 DIAGNOSIS — H31012 Macula scars of posterior pole (postinflammatory) (post-traumatic), left eye: Secondary | ICD-10-CM | POA: Diagnosis not present

## 2018-09-18 DIAGNOSIS — Z794 Long term (current) use of insulin: Secondary | ICD-10-CM | POA: Diagnosis not present

## 2018-09-18 DIAGNOSIS — H5213 Myopia, bilateral: Secondary | ICD-10-CM | POA: Diagnosis not present

## 2018-10-02 ENCOUNTER — Other Ambulatory Visit: Payer: 59

## 2018-10-02 ENCOUNTER — Other Ambulatory Visit: Payer: Self-pay

## 2018-10-02 DIAGNOSIS — Z113 Encounter for screening for infections with a predominantly sexual mode of transmission: Secondary | ICD-10-CM | POA: Diagnosis not present

## 2018-10-02 DIAGNOSIS — R69 Illness, unspecified: Secondary | ICD-10-CM | POA: Diagnosis not present

## 2018-10-02 DIAGNOSIS — B20 Human immunodeficiency virus [HIV] disease: Secondary | ICD-10-CM

## 2018-10-03 ENCOUNTER — Other Ambulatory Visit: Payer: Self-pay | Admitting: Internal Medicine

## 2018-10-03 DIAGNOSIS — B20 Human immunodeficiency virus [HIV] disease: Secondary | ICD-10-CM

## 2018-10-03 LAB — URINE CYTOLOGY ANCILLARY ONLY
Chlamydia: NEGATIVE
Neisseria Gonorrhea: NEGATIVE

## 2018-10-03 LAB — T-HELPER CELL (CD4) - (RCID CLINIC ONLY)
CD4 % Helper T Cell: 28 % — ABNORMAL LOW (ref 33–65)
CD4 T Cell Abs: 782 /uL (ref 400–1790)

## 2018-10-10 LAB — CBC WITH DIFFERENTIAL/PLATELET
Absolute Monocytes: 429 cells/uL (ref 200–950)
Basophils Absolute: 40 cells/uL (ref 0–200)
Basophils Relative: 0.6 %
Eosinophils Absolute: 152 cells/uL (ref 15–500)
Eosinophils Relative: 2.3 %
HCT: 42.1 % (ref 35.0–45.0)
Hemoglobin: 13.9 g/dL (ref 11.7–15.5)
Lymphs Abs: 2950 cells/uL (ref 850–3900)
MCH: 28.4 pg (ref 27.0–33.0)
MCHC: 33 g/dL (ref 32.0–36.0)
MCV: 86.1 fL (ref 80.0–100.0)
MPV: 10.4 fL (ref 7.5–12.5)
Monocytes Relative: 6.5 %
Neutro Abs: 3029 cells/uL (ref 1500–7800)
Neutrophils Relative %: 45.9 %
Platelets: 357 10*3/uL (ref 140–400)
RBC: 4.89 10*6/uL (ref 3.80–5.10)
RDW: 13.4 % (ref 11.0–15.0)
Total Lymphocyte: 44.7 %
WBC: 6.6 10*3/uL (ref 3.8–10.8)

## 2018-10-10 LAB — COMPLETE METABOLIC PANEL WITH GFR
AG Ratio: 1.4 (calc) (ref 1.0–2.5)
ALT: 11 U/L (ref 6–29)
AST: 11 U/L (ref 10–30)
Albumin: 3.8 g/dL (ref 3.6–5.1)
Alkaline phosphatase (APISO): 78 U/L (ref 31–125)
BUN/Creatinine Ratio: 10 (calc) (ref 6–22)
BUN: 6 mg/dL — ABNORMAL LOW (ref 7–25)
CO2: 24 mmol/L (ref 20–32)
Calcium: 8.9 mg/dL (ref 8.6–10.2)
Chloride: 101 mmol/L (ref 98–110)
Creat: 0.58 mg/dL (ref 0.50–1.10)
GFR, Est African American: 134 mL/min/{1.73_m2} (ref >=60)
GFR, Est Non African American: 115 mL/min/{1.73_m2} (ref >=60)
Globulin: 2.8 g/dL (calc) (ref 1.9–3.7)
Glucose, Bld: 173 mg/dL — ABNORMAL HIGH (ref 65–99)
Potassium: 4.4 mmol/L (ref 3.5–5.3)
Sodium: 138 mmol/L (ref 135–146)
Total Bilirubin: 0.3 mg/dL (ref 0.2–1.2)
Total Protein: 6.6 g/dL (ref 6.1–8.1)

## 2018-10-10 LAB — HIV-1 RNA QUANT-NO REFLEX-BLD
HIV 1 RNA Quant: 63 copies/mL — ABNORMAL HIGH
HIV-1 RNA Quant, Log: 1.8 Log copies/mL — ABNORMAL HIGH

## 2018-10-10 LAB — RPR: RPR Ser Ql: NONREACTIVE

## 2018-10-17 ENCOUNTER — Encounter: Payer: Self-pay | Admitting: Internal Medicine

## 2018-10-17 ENCOUNTER — Ambulatory Visit (INDEPENDENT_AMBULATORY_CARE_PROVIDER_SITE_OTHER): Payer: 59 | Admitting: Internal Medicine

## 2018-10-17 ENCOUNTER — Other Ambulatory Visit: Payer: Self-pay

## 2018-10-17 VITALS — BP 110/77 | HR 101 | Temp 98.4°F | Wt 218.0 lb

## 2018-10-17 DIAGNOSIS — Z79899 Other long term (current) drug therapy: Secondary | ICD-10-CM | POA: Diagnosis not present

## 2018-10-17 DIAGNOSIS — B20 Human immunodeficiency virus [HIV] disease: Secondary | ICD-10-CM | POA: Diagnosis not present

## 2018-10-17 DIAGNOSIS — Z113 Encounter for screening for infections with a predominantly sexual mode of transmission: Secondary | ICD-10-CM | POA: Diagnosis not present

## 2018-10-17 DIAGNOSIS — Z23 Encounter for immunization: Secondary | ICD-10-CM | POA: Diagnosis not present

## 2018-10-17 DIAGNOSIS — R69 Illness, unspecified: Secondary | ICD-10-CM | POA: Diagnosis not present

## 2018-10-17 DIAGNOSIS — F17200 Nicotine dependence, unspecified, uncomplicated: Secondary | ICD-10-CM

## 2018-10-17 DIAGNOSIS — G63 Polyneuropathy in diseases classified elsewhere: Secondary | ICD-10-CM

## 2018-10-17 DIAGNOSIS — E0800 Diabetes mellitus due to underlying condition with hyperosmolarity without nonketotic hyperglycemic-hyperosmolar coma (NKHHC): Secondary | ICD-10-CM | POA: Diagnosis not present

## 2018-10-17 MED ORDER — GENVOYA 150-150-200-10 MG PO TABS: 1.0000 | ORAL_TABLET | Freq: Every day | ORAL | 11 refills | Status: DC

## 2018-10-17 MED ORDER — GABAPENTIN 300 MG PO CAPS: ORAL_CAPSULE | ORAL | 11 refills | Status: DC

## 2018-10-17 NOTE — Assessment & Plan Note (Signed)
Creat ok

## 2018-10-17 NOTE — Assessment & Plan Note (Signed)
Prevnar discussed and given

## 2018-10-17 NOTE — Progress Notes (Signed)
   Subjective:    Patient ID: Melissa Cruz, female    DOB: 1980/05/06, 38 y.o.   MRN: [id number]  HPI Here for follow up of HIV Here for her yearly visit.  Has continued to take ARVs and no missed doses.  Had lost weight with a keto diet, exercise but since COVID-19, gym closers, has gained most of it back.  Feels well overall though.  CD4 of 782, viral load just 63.  Creat, LFTs ok.  No associated n/v/d.  No rashes.    Review of Systems  Constitutional: Negative for fatigue.  Gastrointestinal: Negative for diarrhea.  Skin: Negative for rash.  Neurological: Negative for dizziness.       Objective:   Physical Exam Constitutional:      General: She is not in acute distress.    Appearance: She is well-developed.  HENT:     Mouth/Throat:     Pharynx: No oropharyngeal exudate.  Eyes:     General: No scleral icterus. Cardiovascular:     Rate and Rhythm: Normal rate and regular rhythm.     Heart sounds: Normal heart sounds. No murmur.  Pulmonary:     Effort: Pulmonary effort is normal. No respiratory distress.     Breath sounds: Normal breath sounds.  Skin:    Findings: No rash.    SH: continues to smoke but trying to quit       Assessment & Plan:

## 2018-10-17 NOTE — Assessment & Plan Note (Signed)
Screened negative 

## 2018-10-17 NOTE — Assessment & Plan Note (Signed)
Has stopped seeing her endocrinologist and does not have a PCP.  She will get a PCP now.  Providers discussed.

## 2018-10-17 NOTE — Assessment & Plan Note (Signed)
Doing well with this and will continue with the same.  Refills sent for 1 year.   rtc 1 year

## 2018-10-17 NOTE — Assessment & Plan Note (Signed)
Emphasized smoking cessation.

## 2018-11-20 ENCOUNTER — Telehealth: Payer: Self-pay | Admitting: Pharmacy Technician

## 2018-11-20 NOTE — Telephone Encounter (Signed)
[facility] Patient Advocate Encounter   Was successful in obtaining a [place] copay card for [employer].  This copay card will make the patients copay 0.  I have spoken with the patient.    The billing information is as follows and has been shared with [place].  RxBin: [id number] PCN: ACCESS Member ID: [id number] Group ID: [id number]  [place] Nadara Mustard [place] Patient [facility] Phone: [phone] Fax:  [phone]

## 2018-12-16 DIAGNOSIS — Z20828 Contact with and (suspected) exposure to other viral communicable diseases: Secondary | ICD-10-CM | POA: Diagnosis not present

## 2019-01-08 DIAGNOSIS — Z1231 Encounter for screening mammogram for malignant neoplasm of breast: Secondary | ICD-10-CM | POA: Diagnosis not present

## 2019-01-08 DIAGNOSIS — Z6837 Body mass index (BMI) 37.0-37.9, adult: Secondary | ICD-10-CM | POA: Diagnosis not present

## 2019-01-08 DIAGNOSIS — Z01419 Encounter for gynecological examination (general) (routine) without abnormal findings: Secondary | ICD-10-CM | POA: Diagnosis not present

## 2019-01-08 DIAGNOSIS — N760 Acute vaginitis: Secondary | ICD-10-CM | POA: Diagnosis not present

## 2019-01-08 DIAGNOSIS — Z118 Encounter for screening for other infectious and parasitic diseases: Secondary | ICD-10-CM | POA: Diagnosis not present

## 2019-01-12 DIAGNOSIS — Z23 Encounter for immunization: Secondary | ICD-10-CM | POA: Diagnosis not present

## 2019-03-11 DIAGNOSIS — Z20828 Contact with and (suspected) exposure to other viral communicable diseases: Secondary | ICD-10-CM | POA: Diagnosis not present

## 2019-05-28 ENCOUNTER — Other Ambulatory Visit: Payer: Self-pay | Admitting: Internal Medicine

## 2019-05-28 ENCOUNTER — Encounter: Payer: Self-pay | Admitting: Internal Medicine

## 2019-05-28 DIAGNOSIS — E1100 Type 2 diabetes mellitus with hyperosmolarity without nonketotic hyperglycemic-hyperosmolar coma (NKHHC): Secondary | ICD-10-CM

## 2019-05-28 NOTE — Progress Notes (Signed)
Patient ID: Melissa Cruz, female   DOB: 1980/08/23, 39 y.o.   MRN: [id number] Patient called to see if Dr Luciana Axe could refer her to [facility] Endocrinology to Dr Elvera Lennox states her insurance recommended them  Staff message to Dr Luciana Axe

## 2019-06-28 ENCOUNTER — Ambulatory Visit: Payer: 59 | Admitting: Internal Medicine

## 2019-06-28 ENCOUNTER — Other Ambulatory Visit: Payer: Self-pay

## 2019-06-28 ENCOUNTER — Encounter: Payer: Self-pay | Admitting: Internal Medicine

## 2019-06-28 VITALS — BP 120/82 | HR 92 | Ht 63.0 in | Wt 209.0 lb

## 2019-06-28 DIAGNOSIS — Z794 Long term (current) use of insulin: Secondary | ICD-10-CM

## 2019-06-28 DIAGNOSIS — E1340 Other specified diabetes mellitus with diabetic neuropathy, unspecified: Secondary | ICD-10-CM

## 2019-06-28 LAB — POCT GLYCOSYLATED HEMOGLOBIN (HGB A1C): Hemoglobin A1C: 12.1 % — AB (ref 4.0–5.6)

## 2019-06-28 MED ORDER — TRESIBA FLEXTOUCH 200 UNIT/ML ~~LOC~~ SOPN: 20.0000 [IU] | PEN_INJECTOR | Freq: Every day | SUBCUTANEOUS | 3 refills | Status: DC

## 2019-06-28 MED ORDER — OZEMPIC (0.25 OR 0.5 MG/DOSE) 2 MG/1.5ML ~~LOC~~ SOPN: 0.5000 mg | PEN_INJECTOR | SUBCUTANEOUS | 5 refills | Status: DC

## 2019-06-28 MED ORDER — INSULIN PEN NEEDLE 32G X 4 MM MISC: 3 refills | Status: DC

## 2019-06-28 MED ORDER — ONETOUCH VERIO VI STRP: ORAL_STRIP | 3 refills | Status: DC

## 2019-06-28 MED ORDER — ONETOUCH DELICA LANCETS 33G MISC: 3 refills | Status: DC

## 2019-06-28 NOTE — Progress Notes (Signed)
Patient ID: Melissa Cruz, female   DOB: June 07, 1980, 39 y.o.   MRN: [id number]   This visit occurred during the SARS-CoV-2 public health emergency.  Safety protocols were in place, including screening questions prior to the visit, additional usage of staff PPE, and extensive cleaning of exam room while observing appropriate contact time as indicated for disinfecting solutions.   HPI: Melissa Cruz is a 39 y.o.-year-old female, referred by Dr. Linus Salmons, for management of DM, dx in 2014, GDM in ~2008 insulin-dependent since 2018, uncontrolled, with long-term complications (PN).  She did see endocrinology in the past (Dr. Chalmers Cater), but I do not have these records.  Reviewed latest HbA1c levels: Lab Results  Component Value Date   HGBA1C 8.4 (H) 11/09/2016   HGBA1C 9.0 (H) 08/17/2012   HGBA1C 7.8 (H) 04/27/2012   She was tested for insulin deficiency >> she did have a low C-peptide (no records)  Pt is not on any medications for this, came off her insulin when she started a keto diet, lost weight, and her sugars improved.  However, during the coronavirus pandemic, sugars worsened again and her weight increased.  She is not checking sugars but feels poorly and she thinks that they are higher.  Previous regimen: She was on NovoLog 70/30 40 units 2x a day. She tried Metformin IR and ER >> significant diarrhea.   Pt is not checking sugars. - am: n/c - 2h after b'fast: n/c - before lunch: n/c - 2h after lunch: n/c - before dinner: n/c - 2h after dinner: n/c - bedtime: n/c - nighttime: n/c Lowest sugar was 90s; she has hypoglycemia awareness at120.  Highest sugar was 380.  Glucometer: One Touch Verio  Pt's meals are: - Breakfast: cereals, eggs, sausage, toast, pancakes (dinner for her) - Lunch: soup, sub, chicken, salad - Dinner:chicken, fish, fries - Snacks: 3-4 She tried Keto diet for 3 mo >> lost weight >> could come off insulin. She works night shift: 8 pm-8 am. Before  the pandemic, she was exercising (spin classes) but then stopped.  She restarted them recently.  Also does yoga.  - no CKD, last BUN/creatinine:  Lab Results  Component Value Date   BUN 6 (L) 10/02/2018   BUN 7 09/29/2017   CREATININE 0.58 10/02/2018   CREATININE 0.56 09/29/2017  She is not on ACE inhibitor.  -+ HL; last set of lipids: Lab Results  Component Value Date   CHOL 185 02/17/2016   HDL 35 (L) 02/17/2016   LDLCALC 107 (H) 02/17/2016   TRIG 215 (H) 02/17/2016   CHOLHDL 5.3 (H) 02/17/2016  She is not on a statin.  - last eye exam was in 02/2019. No DR reportedly  - + numbness and tingling in her R thigh, not feet.  Pt has FH of DM in 2 uncles.  She has a history of HIV infection, for which she is seeing Dr. Linus Salmons.  Her mother was just diagnosed with stage IV breast cancer.  ROS: Constitutional: + weight gain, no weight loss, + fatigue, no subjective hyperthermia, no subjective hypothermia, no nocturia, + excessive urination Eyes: no blurry vision, no xerophthalmia ENT: no sore throat, no nodules palpated in neck, no dysphagia, no odynophagia, no hoarseness, no tinnitus, no hypoacusis Cardiovascular: no CP, no SOB, no palpitations, no leg swelling Respiratory: no cough, no SOB, no wheezing Gastrointestinal: no N, no V, no D, + C, + acid reflux Musculoskeletal: no muscle, no joint aches Skin: no rash, no hair loss Neurological: no tremors,  no numbness or tingling/no dizziness/+ HAs Psychiatric: no depression, no anxiety  Past Medical History:  Diagnosis Date  . Anemia   . History of anemia   . HIV positive ([place])   . Hydradenitis 02/2013   right axilla  . Non-insulin dependent type 2 diabetes mellitus ([place])    gestational only per patient   Past Surgical History:  Procedure Laterality Date  . CHOLECYSTECTOMY  1999  . HYDRADENITIS EXCISION Right 02/14/2013   Procedure: WIDE EXCISION HIDRADENITIS RIGHT AXILLA;  Surgeon: Harl Bowie, MD;  Location:  [place];  Service: General;  Laterality: Right;  . TUBAL LIGATION  11/22/2005  . WISDOM TOOTH EXTRACTION     Social History   Socioeconomic History  . Marital status: Divorced    Spouse name: Not on file  . Number of children: 3: 21, 20, 13 - 06/2019  . Years of education: 25  . Highest education level: Not on file  Occupational History   RN  hospice   Tobacco Use  . Smoking status: Current Every Day Smoker    Packs/day: 0.50    Years: 7.00    Pack years: 3.50    Types: Cigarettes  . Smokeless tobacco: Never Used  Substance and Sexual Activity  . Alcohol use: No    Alcohol/week: 0.0 standard drinks  . Drug use: No  . Sexual activity: Yes    Partners: Male    Birth control/protection: Condom    Comment: pt. declined condoms  Other Topics Concern  . Not on file  Social History Narrative  . Not on file   Social Determinants of Health   Financial Resource Strain:   . Difficulty of Paying Living Expenses:   Food Insecurity:   . Worried About Charity fundraiser in the Last Year:   . Arboriculturist in the Last Year:   Transportation Needs:   . Film/video editor (Medical):   Marland Kitchen Lack of Transportation (Non-Medical):   Physical Activity:   . Days of Exercise per Week:   . Minutes of Exercise per Session:   Stress:   . Feeling of Stress :   Social Connections:   . Frequency of Communication with Friends and Family:   . Frequency of Social Gatherings with Friends and Family:   . Attends Religious Services:   . Active Member of Clubs or Organizations:   . Attends Archivist Meetings:   Marland Kitchen Marital Status:   Intimate Partner Violence:   . Fear of Current or Ex-Partner:   . Emotionally Abused:   Marland Kitchen Physically Abused:   . Sexually Abused:    Current Outpatient Medications on File Prior to Visit  Medication Sig Dispense Refill  . elvitegravir-cobicistat-emtricitabine-tenofovir (GENVOYA) 150-150-200-10 MG TABS tablet Take 1 tablet by mouth  daily. 30 tablet 11  . gabapentin (NEURONTIN) 300 MG capsule Take 1 pill 3 times a day 90 capsule 11  . Blood Glucose Monitoring Suppl (BLOOD GLUCOSE METER) kit Use as instructed 1 each 0   No current facility-administered medications on file prior to visit.   Allergies  Allergen Reactions  . Shellfish Allergy Shortness Of Breath  . Hydrocodone Nausea And Vomiting   Family History  Problem Relation Age of Onset  . Cancer Maternal Grandfather   . Diabetes Maternal Uncle   . Cancer Maternal Grandmother     PE: BP 120/82   Pulse 92   Ht '5\' 3"'$  (1.6 m)   Wt 209 lb (94.8 kg)  SpO2 99%   BMI 37.02 kg/m  Wt Readings from Last 3 Encounters:  06/28/19 209 lb (94.8 kg)  10/17/18 218 lb (98.9 kg)  10/13/17 190 lb (86.2 kg)   Constitutional: overweight, in NAD Eyes: PERRLA, EOMI, no exophthalmos ENT: moist mucous membranes, no thyromegaly, no cervical lymphadenopathy Cardiovascular: RRR, No MRG Respiratory: CTA B Gastrointestinal: abdomen soft, NT, ND, BS+ Musculoskeletal: no deformities, strength intact in all 4 Skin: moist, warm, no rashes Neurological: no tremor with outstretched hands, DTR normal in all 4  ASSESSMENT: 1. DM, insulin-dependent, uncontrolled, with complications - PN  PLAN:  1. Patient with long-standing, uncontrolled diabetes, off antidiabetic medications, with an HbA1c at this visit of 12.1% (extremely high).  She feels overwhelmed by her diabetes and wants to start fresh in taking care of it. -At this visit, we discussed about the importance of diet.  She works night shift and she is snacking through the night.  We discussed that this is not conducive to good control and she will need to stop.  The reversed circadian rhythm will cause poor diabetes control, however, if she had snacking and otherwise a poor diet, this will be very difficult to control.  We discussed about referral to nutrition but would like to hold off for now.  We may need to refer her at next  visit.  I did give her written instructions about dietary suggestions. -She tells me that she was told by previous endocrinologist that she may have insulin deficiency.  I do not have this information.  I plan to check her for this at next visit, but for now, sugars are probably too high to check.  We will need to start insulin and I suggested [place], which is better suitable for shift workers.  We will start at 20 units and I advised her how to titrate the dose. -We will also try to start Ozempic, which will help not only with diabetes but also with her appetite and weight.  Discussed about benefits and possible side effects.  We will start at a low dose and increase as tolerated. -We may need to add rapid acting insulin in the future, but for now, we will start with diet changes along with the above regimen. - I suggested to:  Patient Instructions  Please start: - Tresiba 20 units daily If your sugars in am are not <130, then increase by 4 units every 4 days until you reach 40.  Please start: - Ozempic 0.25 mg weekly in a.m. (for example on Sunday morning) x 4 weeks, then increase to 0.5 mg weekly in a.m. if no nausea or hypoglycemia.  Please let me know if the sugars are consistently <80 or >200.  Please return in 1.5 months with your sugar log.   - Strongly advised her to start checking sugars at different times of the day - check 2-3x a day, rotating checks - discussed about CBG targets for treatment: 80-130 mg/dL before meals and <180 mg/dL after meals; target HbA1c <7%. - given sugar log and advised how to fill it and to bring it at next appt  - given foot care handout and explained the principles  - given instructions for hypoglycemia management "15-15 rule"  - advised for yearly eye exams  - Return to clinic in 1.5 mo with sugar log   Philemon Kingdom, MD PhD [facility] Endocrinology

## 2019-06-28 NOTE — Addendum Note (Signed)
Addended by: Darliss Ridgel I on: 06/28/2019 02:52 PM   Modules accepted: Orders

## 2019-06-28 NOTE — Patient Instructions (Addendum)
Please start: - Tresiba 20 units daily If your sugars in am are not <130, then increase by 4 units every 4 days until you reach 40.  Please start: - Ozempic 0.25 mg weekly in a.m. (for example on Sunday morning) x 4 weeks, then increase to 0.5 mg weekly in a.m. if no nausea or hypoglycemia.  Please let me know if the sugars are consistently <80 or >200.  Please return in 1.5 months with your sugar log.   PATIENT INSTRUCTIONS FOR TYPE 2 DIABETES:  DIET AND EXERCISE Diet and exercise is an important part of diabetic treatment.  We recommended aerobic exercise in the form of brisk walking (working between 40-60% of maximal aerobic capacity, similar to brisk walking) for 150 minutes per week (such as 30 minutes five days per week) along with 3 times per week performing 'resistance' training (using various gauge rubber tubes with handles) 5-10 exercises involving the major muscle groups (upper body, lower body and core) performing 10-15 repetitions (or near fatigue) each exercise. Start at half the above goal but build slowly to reach the above goals. If limited by weight, joint pain, or disability, we recommend daily walking in a swimming pool with water up to waist to reduce pressure from joints while allow for adequate exercise.    BLOOD GLUCOSES Monitoring your blood glucoses is important for continued management of your diabetes. Please check your blood glucoses 2-4 times a day: fasting, before meals and at bedtime (you can rotate these measurements - e.g. one day check before the 3 meals, the next day check before 2 of the meals and before bedtime, etc.).   HYPOGLYCEMIA (low blood sugar) Hypoglycemia is usually a reaction to not eating, exercising, or taking too much insulin/ other diabetes drugs.  Symptoms include tremors, sweating, hunger, confusion, headache, etc. Treat IMMEDIATELY with 15 grams of Carbs: . 4 glucose tablets .  cup regular juice/soda . 2 tablespoons raisins . 4  teaspoons sugar . 1 tablespoon honey Recheck blood glucose in 15 mins and repeat above if still symptomatic/blood glucose <100.  RECOMMENDATIONS TO REDUCE YOUR RISK OF DIABETIC COMPLICATIONS: * Take your prescribed MEDICATION(S) * Follow a DIABETIC diet: Complex carbs, fiber rich foods, (monounsaturated and polyunsaturated) fats * AVOID saturated/trans fats, high fat foods, >2,300 mg salt per day. * EXERCISE at least 5 times a week for 30 minutes or preferably daily.  * DO NOT SMOKE OR DRINK more than 1 drink a day. * Check your FEET every day. Do not wear tightfitting shoes. Contact us if you develop an ulcer * See your EYE doctor once a year or more if needed * Get a FLU shot once a year * Get a PNEUMONIA vaccine once before and once after age 45 years  GOALS:  * Your Hemoglobin A1c of <7%  * fasting sugars need to be <130 * after meals sugars need to be <180 (2h after you start eating) * Your Systolic BP should be 638 or lower  * Your Diastolic BP should be 80 or lower  * Your HDL (Good Cholesterol) should be 40 or higher  * Your LDL (Bad Cholesterol) should be 100 or lower. * Your Triglycerides should be 150 or lower  * Your Urine microalbumin (kidney function) should be <30 * Your Body Mass Index should be 25 or lower    Please consider the following ways to cut down carbs and fat and increase fiber and micronutrients in your diet: - substitute whole grain for white bread  or pasta - substitute brown rice for white rice - substitute 90-calorie flat bread pieces for slices of bread when possible - substitute sweet potatoes or yams for white potatoes - substitute humus for margarine - substitute tofu for cheese when possible - substitute almond or rice milk for regular milk (would not drink soy milk daily due to concern for soy estrogen influence on breast cancer risk) - substitute dark chocolate for other sweets when possible - substitute water - can add lemon or orange slices  for taste - for diet sodas (artificial sweeteners will trick your body that you can eat sweets without getting calories and will lead you to overeating and weight gain in the long run) - do not skip breakfast or other meals (this will slow down the metabolism and will result in more weight gain over time)  - can try smoothies made from fruit and almond/rice milk in am instead of regular breakfast - can also try old-fashioned (not instant) oatmeal made with almond/rice milk in am - order the dressing on the side when eating salad at a restaurant (pour less than half of the dressing on the salad) - eat as little meat as possible - can try juicing, but should not forget that juicing will get rid of the fiber, so would alternate with eating raw veg./fruits or drinking smoothies - use as little oil as possible, even when using olive oil - can dress a salad with a mix of balsamic vinegar and lemon juice, for e.g. - use agave nectar, stevia sugar, or regular sugar rather than artificial sweateners - steam or broil/roast veggies  - snack on veggies/fruit/nuts (unsalted, preferably) when possible, rather than processed foods - reduce or eliminate aspartame in diet (it is in diet sodas, chewing gum, etc) Read the labels!  Try to read Dr. Katherina Right book: "Program for Reversing Diabetes" for other ideas for healthy eating.

## 2019-07-02 ENCOUNTER — Encounter: Payer: Self-pay | Admitting: Internal Medicine

## 2019-07-03 ENCOUNTER — Encounter: Payer: Self-pay | Admitting: Internal Medicine

## 2019-07-04 ENCOUNTER — Ambulatory Visit: Payer: 59 | Admitting: Internal Medicine

## 2019-08-06 ENCOUNTER — Encounter: Payer: 59 | Attending: Internal Medicine | Admitting: Dietician

## 2019-08-06 ENCOUNTER — Encounter: Payer: Self-pay | Admitting: Dietician

## 2019-08-06 ENCOUNTER — Other Ambulatory Visit: Payer: Self-pay

## 2019-08-06 DIAGNOSIS — E1100 Type 2 diabetes mellitus with hyperosmolarity without nonketotic hyperglycemic-hyperosmolar coma (NKHHC): Secondary | ICD-10-CM | POA: Insufficient documentation

## 2019-08-06 NOTE — Patient Instructions (Signed)
Plan: Great job on the changes that you have made! Remember your own self care Consider decreasing breakfast meat Mindfulness  Aim for 2-3 Carb Choices per meal (30-45 grams) +/- 1 either way  Aim for 0-1 Carbs per snack if hungry  Include protein in moderation with your meals and snacks Consider reading food labels for Total Carbohydrate of foods Consider  increasing your activity level by cycling for 30 minutes daily as tolerated Consider checking BG at alternate times per day  Consider taking medication as directed by MD

## 2019-08-06 NOTE — Progress Notes (Signed)
Diabetes Self-Management Education  Visit Type: First/Initial  Appt. Start Time: 1520 Appt. End Time: 1630  08/10/2019  Melissa Cruz, identified by name and date of birth, is a 39 y.o. female with a diagnosis of Diabetes: Type 2.   ASSESSMENT Patient of Dr. Elvera Lennox is here today alone for her nutrition appointment. She is unsure of what she would like to learn.  She states that she would like to learn how to count calories. She is here to be compliant.  History includes Type 2 diabetes, GDM, neuropathy right thigh, smoking, and HIV. She switched jobs and was between providers and was without medication for a time leading to uncontrolled diabetes. She feels that her blood sugar management is better than it has ever been. A1C  12.1% 06/28/19 but now states that her blood glucose is 80-100 fasting and < 120 after meals. Medications include: Ozempic and Tresiba 28 units which she takes sometimes during the day and sometimes at night depending on what shift that she is working.  Discussed the [facility] but does not want this at this time.  Went on Keto 2019 and lost 40 pounds but regained mos of the weight since the Pandemic 185 lbs 2019 213 lbs 2020 206 lbs 08/06/2019  Patient lives with 2 child and is getting married in July.  She does the shopping and cooking.  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  She flips shifts and works nights. She enjoys cycling at [employer].  She also enjoys group classes.  Height 5\' 3"  (1.6 m), weight 206 lb (93.4 kg). Body mass index is 36.49 kg/m.  Diabetes Self-Management Education - 08/06/19 1559      Visit Information   Visit Type  First/Initial      Initial Visit   Diabetes Type  Type 2    Are you currently following a meal plan?  No    Are you taking your medications as prescribed?  Yes    Date Diagnosed  after last child      Health Coping   How would you rate your overall health?  Fair      Psychosocial Assessment   Patient Belief/Attitude about Diabetes  Motivated to manage diabetes    Self-care barriers  None    Self-management support  Doctor's office;Family    Other persons present  Patient    Patient Concerns  Nutrition/Meal planning;Glycemic Control    Special Needs  None    Preferred Learning Style  No preference indicated    Learning Readiness  Ready    How often do you need to have someone help you when you read instructions, pamphlets, or other written materials from your doctor or pharmacy?  1 - Never    What is the last grade level you completed in school?  college      Pre-Education Assessment   Patient understands the diabetes disease and treatment process.  Needs Review    Patient understands incorporating nutritional management into lifestyle.  Needs Review    Patient undertands incorporating physical activity into lifestyle.  Needs Review    Patient understands using medications safely.  Needs Review    Patient understands monitoring blood glucose, interpreting and using results  Needs Review    Patient understands prevention, detection, and treatment of acute complications.  Needs Review    Patient understands prevention, detection, and treatment of chronic complications.  Needs Review    Patient understands how to develop  strategies to address psychosocial issues.  Needs Review    Patient understands how to develop strategies to promote health/change behavior.  Needs Review      Complications   Last HgB A1C per patient/outside source  12.1 %   06/28/19   How often do you check your blood sugar?  1-2 times/day    Fasting Blood glucose range (mg/dL)  92-426    Postprandial Blood glucose range (mg/dL)  83-419    Have you had a dilated eye exam in the past 12 months?  Yes    Have you had a dental exam in the past 12 months?  Yes    Are you checking your feet?  Yes    How many days per week are you checking your feet?  7      Dietary Intake   Breakfast   bacon or sausage, eggs, grits, white toast OR pancakes with regular syrup, eggs, meat    Lunch  skips usually unless missed breakfast (sleeping)    Snack (afternoon)  sugar free jello, pickles, sunflower seeds    Dinner  meat, green, carb OR chicken salad, crackers OR tuna salad over greens OR sub OR chicken soup   2:30 am   Snack (evening)  occasional chocolate, sugar free jello, pickles, sunflower seeds    Beverage(s)  water, coffee with stevia and creamer, diet coke, diet sprite, seltzer water      Exercise   Exercise Type  Moderate (swimming / aerobic walking)    How many days per week to you exercise?  3    How many minutes per day do you exercise?  45    Total minutes per week of exercise  135      Patient Education   Previous Diabetes Education  Yes (please comment)   unknown   Disease state   Definition of diabetes, type 1 and 2, and the diagnosis of diabetes    Nutrition management   Role of diet in the treatment of diabetes and the relationship between the three main macronutrients and blood glucose level;Food label reading, portion sizes and measuring food.;Meal options for control of blood glucose level and chronic complications.    Physical activity and exercise   Role of exercise on diabetes management, blood pressure control and cardiac health.    Medications  Reviewed patients medication for diabetes, action, purpose, timing of dose and side effects.    Monitoring  Purpose and frequency of SMBG.;Identified appropriate SMBG and/or A1C goals.    Acute complications  Taught treatment of hypoglycemia - the 15 rule.    Chronic complications  Relationship between chronic complications and blood glucose control    Psychosocial adjustment  Worked with patient to identify barriers to care and solutions;Role of stress on diabetes      Individualized Goals (developed by patient)   Nutrition  General guidelines for healthy choices and portions discussed    Physical Activity  Exercise  3-5 times per week;30 minutes per day    Medications  take my medication as prescribed    Monitoring   test my blood glucose as discussed    Health Coping  discuss diabetes with (comment)      Post-Education Assessment   Patient understands the diabetes disease and treatment process.  Demonstrates understanding / competency    Patient understands incorporating nutritional management into lifestyle.  Demonstrates understanding / competency    Patient undertands incorporating physical activity into lifestyle.  Demonstrates understanding / competency  Patient understands using medications safely.  Demonstrates understanding / competency    Patient understands monitoring blood glucose, interpreting and using results  Demonstrates understanding / competency    Patient understands prevention, detection, and treatment of acute complications.  Demonstrates understanding / competency    Patient understands prevention, detection, and treatment of chronic complications.  Demonstrates understanding / competency    Patient understands how to develop strategies to address psychosocial issues.  Demonstrates understanding / competency    Patient understands how to develop strategies to promote health/change behavior.  Demonstrates understanding / competency      Outcomes   Expected Outcomes  Demonstrated interest in learning. Expect positive outcomes    Future DMSE  PRN    Program Status  Completed       Individualized Plan for Diabetes Self-Management Training:   Learning Objective:  Patient will have a greater understanding of diabetes self-management. Patient education plan is to attend individual and/or group sessions per assessed needs and concerns.   Plan:   Patient Instructions  Plan: Doristine Devoid job on the changes that you have made! Remember your own self care Consider decreasing breakfast meat Mindfulness  Aim for 2-3 Carb Choices per meal (30-45 grams) +/- 1 either way  Aim for 0-1 Carbs  per snack if hungry  Include protein in moderation with your meals and snacks Consider reading food labels for Total Carbohydrate of foods Consider  increasing your activity level by cycling for 30 minutes daily as tolerated Consider checking BG at alternate times per day  Consider taking medication as directed by MD    Expected Outcomes:  Demonstrated interest in learning. Expect positive outcomes  Education material provided: ADA - How to Thrive: A Guide for Your Journey with Diabetes, Food label handouts, Meal plan card and Snack sheet  If problems or questions, patient to contact team via:  Phone and Email  Future DSME appointment: PRN

## 2019-08-10 ENCOUNTER — Other Ambulatory Visit: Payer: Self-pay

## 2019-08-10 ENCOUNTER — Encounter: Payer: Self-pay | Admitting: Dietician

## 2019-08-14 ENCOUNTER — Ambulatory Visit (INDEPENDENT_AMBULATORY_CARE_PROVIDER_SITE_OTHER): Payer: 59 | Admitting: Internal Medicine

## 2019-08-14 ENCOUNTER — Encounter: Payer: Self-pay | Admitting: Internal Medicine

## 2019-08-14 ENCOUNTER — Other Ambulatory Visit: Payer: Self-pay

## 2019-08-14 VITALS — BP 120/70 | HR 102 | Ht 63.0 in | Wt 204.0 lb

## 2019-08-14 DIAGNOSIS — E1142 Type 2 diabetes mellitus with diabetic polyneuropathy: Secondary | ICD-10-CM | POA: Diagnosis not present

## 2019-08-14 DIAGNOSIS — E669 Obesity, unspecified: Secondary | ICD-10-CM

## 2019-08-14 DIAGNOSIS — IMO0002 Reserved for concepts with insufficient information to code with codable children: Secondary | ICD-10-CM

## 2019-08-14 DIAGNOSIS — E1165 Type 2 diabetes mellitus with hyperglycemia: Secondary | ICD-10-CM

## 2019-08-14 DIAGNOSIS — E782 Mixed hyperlipidemia: Secondary | ICD-10-CM | POA: Diagnosis not present

## 2019-08-14 DIAGNOSIS — Z794 Long term (current) use of insulin: Secondary | ICD-10-CM | POA: Insufficient documentation

## 2019-08-14 DIAGNOSIS — E1140 Type 2 diabetes mellitus with diabetic neuropathy, unspecified: Secondary | ICD-10-CM | POA: Insufficient documentation

## 2019-08-14 LAB — LIPID PANEL
Cholesterol: 188 mg/dL (ref 0–200)
HDL: 40 mg/dL (ref >39.00)
LDL Cholesterol: 129 mg/dL — ABNORMAL HIGH (ref 0–99)
NonHDL: 148.05
Total CHOL/HDL Ratio: 5
Triglycerides: 93 mg/dL (ref 0.0–149.0)
VLDL: 18.6 mg/dL (ref 0.0–40.0)

## 2019-08-14 LAB — COMPREHENSIVE METABOLIC PANEL
ALT: 10 U/L (ref 0–35)
AST: 12 U/L (ref 0–37)
Albumin: 4.1 g/dL (ref 3.5–5.2)
Alkaline Phosphatase: 61 U/L (ref 39–117)
BUN: 10 mg/dL (ref 6–23)
CO2: 25 mEq/L (ref 19–32)
Calcium: 9.4 mg/dL (ref 8.4–10.5)
Chloride: 102 mEq/L (ref 96–112)
Creatinine, Ser: 0.58 mg/dL (ref 0.40–1.20)
GFR: 138.71 mL/min (ref >60.00)
Glucose, Bld: 94 mg/dL (ref 70–99)
Potassium: 3.6 mEq/L (ref 3.5–5.1)
Sodium: 134 mEq/L — ABNORMAL LOW (ref 135–145)
Total Bilirubin: 0.3 mg/dL (ref 0.2–1.2)
Total Protein: 7.5 g/dL (ref 6.0–8.3)

## 2019-08-14 LAB — MICROALBUMIN / CREATININE URINE RATIO
Creatinine,U: 186.4 mg/dL
Microalb Creat Ratio: 0.7 mg/g (ref 0.0–30.0)
Microalb, Ur: 1.4 mg/dL (ref 0.0–1.9)

## 2019-08-14 NOTE — Progress Notes (Addendum)
Patient ID: Melissa Cruz, female   DOB: 01/22/1981, 39 y.o.   MRN: [id number]   This visit occurred during the SARS-CoV-2 public health emergency.  Safety protocols were in place, including screening questions prior to the visit, additional usage of staff PPE, and extensive cleaning of exam room while observing appropriate contact time as indicated for disinfecting solutions.   HPI: Melissa Cruz is a 39 y.o.-year-old female, initially referred by Dr. Linus Salmons, returning for follow-up for DM, dx in 2014, GDM in ~2008 insulin-dependent since 2018, uncontrolled, with long-term complications (PN).  She did see endocrinology in the past (Dr. Chalmers Cater), but I do not have these records.  Last visit with me 1.5 months ago.  At last visit, she was not on any antidiabetic medications, came off her insulin when she started a keto diet, lost weight, and her sugars improved.  However, during the coronavirus pandemic, sugars worsened again and her weight increased.  She was not checking sugars and was feeling poorly.  An HbA1c returned very high.  Reviewed HbA1c levels: Lab Results  Component Value Date   HGBA1C 12.1 (A) 06/28/2019   HGBA1C 8.4 (H) 11/09/2016   HGBA1C 9.0 (H) 08/17/2012   HGBA1C 7.8 (H) 04/27/2012  She was tested for insulin deficiency >> she did have a low C-peptide (no records)  At last visit she was on: She was on NovoLog 70/30 40 units 2x a day. She tried Metformin IR and ER >> significant diarrhea.   We changed her regimen to: - Tresiba 20 >> 28 units daily - Ozempic 0.5 mg weekly in a.m.  Pt is checking sugars 1-2x a day: - am: n/c >> 83-124, 135 - 2h after b'fast: n/c - before lunch: n/c  - 2h after lunch: n/c - before dinner: n/c >> 108 - 2h after dinner: n/c >> 120 - bedtime: n/c >> 108, 111, 158 - nighttime: n/c Lowest sugar was 90s >> 83; she has hypoglycemia awareness at 80.  Highest sugar was 380 >> 158.  Glucometer: One Touch Verio  Pt's meals  are: - Breakfast: cereals, eggs, sausage, toast, pancakes (dinner for her) - Lunch: soup, sub, chicken, salad - Dinner:chicken, fish, fries - Snacks: 3-4 She tried Keto diet for 3 mo >> lost weight >> could come off insulin. She works night shift: 8 pm-8 am. Before the pandemic, she was exercising (spin classes) but then stopped. She restarted before last visit.  She is also doing yoga.  -No CKD, last BUN/creatinine:  Lab Results  Component Value Date   BUN 6 (L) 10/02/2018   BUN 7 09/29/2017   CREATININE 0.58 10/02/2018   CREATININE 0.56 09/29/2017  She is not on ACE inhibitor.  -+ HL; last set of lipids: Lab Results  Component Value Date   CHOL 185 02/17/2016   HDL 35 (L) 02/17/2016   LDLCALC 107 (H) 02/17/2016   TRIG 215 (H) 02/17/2016   CHOLHDL 5.3 (H) 02/17/2016  She is not on a statin.  - last eye exam was in 02/2019: No DR reportedly  - she has numbness and tingling in her right thigh, but not in her feet  Pt has FH of DM in 2 uncles.  She has a history of HIV infection, for which she is seeing Dr. Linus Salmons.  Her mother was diagnosed with stage IV breast cancer.  ROS: Constitutional: no weight gain/no weight loss, + improved fatigue, no subjective hyperthermia, no subjective hypothermia Eyes: no blurry vision, no xerophthalmia ENT: no sore throat, no  nodules palpated in neck, no dysphagia, no odynophagia, no hoarseness Cardiovascular: no CP/no SOB/no palpitations/no leg swelling Respiratory: no cough/no SOB/no wheezing Gastrointestinal: no N/no V/no D/no C/+ acid reflux Musculoskeletal: no muscle aches/no joint aches Skin: no rashes, no hair loss Neurological: no tremors/no numbness/no tingling/no dizziness, + improved headaches  I reviewed pt's medications, allergies, PMH, social hx, family hx, and changes were documented in the history of present illness. Otherwise, unchanged from my initial visit note.  Past Medical History:  Diagnosis Date  . Anemia   .  History of anemia   . HIV positive ([place])   . Hydradenitis 02/2013   right axilla  . Non-insulin dependent type 2 diabetes mellitus ([place])    gestational only per patient   Past Surgical History:  Procedure Laterality Date  . CHOLECYSTECTOMY  1999  . HYDRADENITIS EXCISION Right 02/14/2013   Procedure: WIDE EXCISION HIDRADENITIS RIGHT AXILLA;  Surgeon: Harl Bowie, MD;  Location: [facility];  Service: General;  Laterality: Right;  . TUBAL LIGATION  11/22/2005  . WISDOM TOOTH EXTRACTION     Social History   Socioeconomic History  . Marital status: Divorced    Spouse name: Not on file  . Number of children: 3: 21, 20, 13 - 06/2019  . Years of education: 34  . Highest education level: Not on file  Occupational History   RN  hospice   Tobacco Use  . Smoking status: Current Every Day Smoker    Packs/day: 0.50    Years: 7.00    Pack years: 3.50    Types: Cigarettes  . Smokeless tobacco: Never Used  Substance and Sexual Activity  . Alcohol use: No    Alcohol/week: 0.0 standard drinks  . Drug use: No  . Sexual activity: Yes    Partners: Male    Birth control/protection: Condom    Comment: pt. declined condoms  Other Topics Concern  . Not on file  Social History Narrative  . Not on file   Social Determinants of Health   Financial Resource Strain:   . Difficulty of Paying Living Expenses:   Food Insecurity:   . Worried About Charity fundraiser in the Last Year:   . Arboriculturist in the Last Year:   Transportation Needs:   . Film/video editor (Medical):   Marland Kitchen Lack of Transportation (Non-Medical):   Physical Activity:   . Days of Exercise per Week:   . Minutes of Exercise per Session:   Stress:   . Feeling of Stress :   Social Connections:   . Frequency of Communication with Friends and Family:   . Frequency of Social Gatherings with Friends and Family:   . Attends Religious Services:   . Active Member of Clubs or Organizations:   . Attends  Archivist Meetings:   Marland Kitchen Marital Status:   Intimate Partner Violence:   . Fear of Current or Ex-Partner:   . Emotionally Abused:   Marland Kitchen Physically Abused:   . Sexually Abused:    Current Outpatient Medications on File Prior to Visit  Medication Sig Dispense Refill  . Blood Glucose Monitoring Suppl (BLOOD GLUCOSE METER) kit Use as instructed 1 each 0  . elvitegravir-cobicistat-emtricitabine-tenofovir (GENVOYA) 150-150-200-10 MG TABS tablet Take 1 tablet by mouth daily. 30 tablet 11  . gabapentin (NEURONTIN) 300 MG capsule Take 1 pill 3 times a day 90 capsule 11  . glucose blood (ONETOUCH VERIO) test strip Use as instructed 3x a day 200  each 3  . insulin degludec (TRESIBA FLEXTOUCH) 200 UNIT/ML FlexTouch Pen Inject 20 Units into the skin daily. 6 pen 3  . Insulin Pen Needle 32G X 4 MM MISC Use 1x a day 100 each 3  . OneTouch Delica Lancets 03Y MISC Check sugars 3x a day 200 each 3  . Semaglutide,0.25 or 0.5MG/DOS, (OZEMPIC, 0.25 OR 0.5 MG/DOSE,) 2 MG/1.5ML SOPN Inject 0.5 mg into the skin once a week. 2 pen 5   No current facility-administered medications on file prior to visit.   Allergies  Allergen Reactions  . Shellfish Allergy Shortness Of Breath  . Hydrocodone Nausea And Vomiting   Family History  Problem Relation Age of Onset  . Cancer Maternal Grandfather   . Diabetes Maternal Uncle   . Cancer Maternal Grandmother     PE: BP 120/70   Pulse (!) 102   Ht 5' 3" (1.6 m)   Wt 204 lb (92.5 kg)   SpO2 99%   BMI 36.14 kg/m  Wt Readings from Last 3 Encounters:  08/14/19 204 lb (92.5 kg)  08/06/19 206 lb (93.4 kg)  06/28/19 209 lb (94.8 kg)   Constitutional: overweight, in NAD Eyes: PERRLA, EOMI, no exophthalmos ENT: moist mucous membranes, no thyromegaly, no cervical lymphadenopathy Cardiovascular: Tachycardia, RR, No MRG Respiratory: CTA B Gastrointestinal: abdomen soft, NT, ND, BS+ Musculoskeletal: no deformities, strength intact in all 4 Skin: moist, warm,  no rashes Neurological: no tremor with outstretched hands, DTR normal in all 4  ASSESSMENT: 1. DM, insulin-dependent, uncontrolled, with complications - PN  2. HL  3.  Obesity class I  PLAN:  1. Patient with longstanding, uncontrolled, diabetes, off antidiabetic medications at last visit with an HbA1c extremely high, at 12.1%.  At that time, she was overwhelmed by her diabetes and we started long-acting insulin and weekly GLP-1 receptor agonist, which she continues today.  We also discussed about improving her diet, especially since she works night shift and has a regular circadian rhythm.  She was snacking through the night, which I advised her to stop.  She wanted to hold off referral to nutrition at last visit, but she changed her mind >> saw the nutritionist since last OV -Of note, she was told by the previous endocrinologist that she may have insulin deficiency.  I do not have these records but plan to check her for this.  We could not do so at last visit, due to high blood sugars, most likely, in the setting of such a high HbA1c. -At this visit, sugars are much better, mostly in the 80s to 90s in the morning.  Later in the day, the sugars are also at goal, especially in the last 2 weeks.  She benefited from seeing the nutritionist and is trying to adjust her diet.  Therefore, for now, we can decrease the [place] to only 20 units daily and continue to check her blood sugars consistently.  At next visit, if needed, we may need to increase her Ozempic dose. -At today's visit, we will check her for type 1 diabetes and also check annual labs: Orders Placed This Encounter  Procedures  . Comprehensive metabolic panel  . Microalbumin / creatinine urine ratio  . Lipid panel  . C-peptide  . Glucose, fasting  . Anti-islet cell antibody  . ZNT8 Antibodies  . Glutamic acid decarboxylase auto abs  - I suggested to:  Patient Instructions  Please decrease: Tyler Aas to 20 units  daily  Continue: - Ozempic 0.5 mg weekly in  a.m.  Please stop at the lab.  Please return in 2 months with your sugar log.   - advised to check sugars at different times of the day - 1-2x a day, rotating check times - advised for yearly eye exams >> she is UTD - return to clinic in 3 months  2. HL - Reviewed latest lipid panel from 2017: All fractions abnormal Lab Results  Component Value Date   CHOL 185 02/17/2016   HDL 35 (L) 02/17/2016   LDLCALC 107 (H) 02/17/2016   TRIG 215 (H) 02/17/2016   CHOLHDL 5.3 (H) 02/17/2016  -She is not on a statin. -We will recheck her lipid panel today-I expect this to improve with improved diabetes control  3.  Obesity class I - Continue Ozempic which should also help with weight loss; decreasing insulin should also help further - lost 5 lbs since last visit   Component     Latest Ref Rng & Units 08/14/2019  Sodium     135 - 145 mEq/L 134 (L)  Potassium     3.5 - 5.1 mEq/L 3.6  Chloride     96 - 112 mEq/L 102  CO2     19 - 32 mEq/L 25  Glucose     70 - 99 mg/dL 94  BUN     6 - 23 mg/dL 10  Creatinine     0.40 - 1.20 mg/dL 0.58  Total Bilirubin     0.2 - 1.2 mg/dL 0.3  Alkaline Phosphatase     39 - 117 U/L 61  AST     0 - 37 U/L 12  ALT     0 - 35 U/L 10  Total Protein     6.0 - 8.3 g/dL 7.5  Albumin     3.5 - 5.2 g/dL 4.1  GFR     >60.00 mL/min 138.71  Calcium     8.4 - 10.5 mg/dL 9.4  Cholesterol     0 - 200 mg/dL 188  Triglycerides     0.0 - 149.0 mg/dL 93.0  HDL Cholesterol     >39.00 mg/dL 40.00  VLDL     0.0 - 40.0 mg/dL 18.6  LDL (calc)     0 - 99 mg/dL 129 (H)  Total CHOL/HDL Ratio      5  NonHDL      148.05  Microalb, Ur     0.0 - 1.9 mg/dL 1.4  Creatinine,U     mg/dL 186.4  MICROALB/CREAT RATIO     0.0 - 30.0 mg/g 0.7  C-Peptide     0.80 - 3.85 ng/mL 1.09  Glucose, Plasma     65 - 99 mg/dL 93  Islet Cell Ab     Neg:<1:1 Negative  ZNT8 Antibodies     <15 U/mL <10  Glutamic Acid Decarb  Ab     <5 IU/mL <5   Labs are not indicative of type 1 diabetes.  LDL is higher than goal.  For now, we will continue to work on improving her diabetes control but did not decreasing, she will need to be on a statin.  Philemon Kingdom, MD PhD [facility]

## 2019-08-14 NOTE — Patient Instructions (Addendum)
Please decrease: - Tresiba to 20 units daily  Continue: - Ozempic 0.5 mg weekly in a.m.  Please stop at the lab.  Please return in 2 months with your sugar log.

## 2019-08-15 LAB — ANTI-ISLET CELL ANTIBODY: Islet Cell Ab: NEGATIVE

## 2019-08-22 LAB — ZNT8 ANTIBODIES: ZNT8 Antibodies: <10 U/mL (ref <15)

## 2019-08-22 LAB — GLUCOSE, FASTING: Glucose, Plasma: 93 mg/dL (ref 65–99)

## 2019-08-22 LAB — C-PEPTIDE: C-Peptide: 1.09 ng/mL (ref 0.80–3.85)

## 2019-08-22 LAB — GLUTAMIC ACID DECARBOXYLASE AUTO ABS: Glutamic Acid Decarb Ab: <5 IU/mL (ref <5)

## 2019-10-16 ENCOUNTER — Ambulatory Visit (INDEPENDENT_AMBULATORY_CARE_PROVIDER_SITE_OTHER): Payer: 59 | Admitting: Internal Medicine

## 2019-10-16 ENCOUNTER — Encounter: Payer: Self-pay | Admitting: Internal Medicine

## 2019-10-16 ENCOUNTER — Other Ambulatory Visit: Payer: Self-pay

## 2019-10-16 VITALS — BP 116/70 | HR 97 | Ht 63.0 in | Wt 203.0 lb

## 2019-10-16 DIAGNOSIS — E1142 Type 2 diabetes mellitus with diabetic polyneuropathy: Secondary | ICD-10-CM | POA: Diagnosis not present

## 2019-10-16 DIAGNOSIS — E1165 Type 2 diabetes mellitus with hyperglycemia: Secondary | ICD-10-CM

## 2019-10-16 DIAGNOSIS — E782 Mixed hyperlipidemia: Secondary | ICD-10-CM | POA: Diagnosis not present

## 2019-10-16 DIAGNOSIS — E669 Obesity, unspecified: Secondary | ICD-10-CM

## 2019-10-16 DIAGNOSIS — IMO0002 Reserved for concepts with insufficient information to code with codable children: Secondary | ICD-10-CM

## 2019-10-16 LAB — POCT GLYCOSYLATED HEMOGLOBIN (HGB A1C): Hemoglobin A1C: 6.4 % — AB (ref 4.0–5.6)

## 2019-10-16 NOTE — Addendum Note (Signed)
Addended by: Darliss Ridgel I on: 10/16/2019 11:38 AM   Modules accepted: Orders

## 2019-10-16 NOTE — Progress Notes (Signed)
Patient ID: Melissa Cruz, female   DOB: 05/24/1980, 39 y.o.   MRN: [id number]   This visit occurred during the SARS-CoV-2 public health emergency.  Safety protocols were in place, including screening questions prior to the visit, additional usage of staff PPE, and extensive cleaning of exam room while observing appropriate contact time as indicated for disinfecting solutions.   HPI: Melissa Cruz is a 39 y.o.-year-old female, initially referred by Dr. Linus Salmons, returning for follow-up for DM, dx in 2014, GDM in ~2008 insulin-dependent since 2018, uncontrolled, with long-term complications (PN).  She did see endocrinology in the past (Dr. Chalmers Cater), but I do not have these records.  Last visit with me 2 months ago.  In 06/2019, she was not on any antidiabetic medications, came off her insulin when she started a keto diet, lost weight, and her sugars improved.  However, during the coronavirus pandemic, sugars worsened again and her weight increased.  She was not checking sugars and was feeling poorly.  An HbA1c returned very high.  However, since then, sugars improved significantly.  She got married last weekend.  Reviewed HbA1c levels: Lab Results  Component Value Date   HGBA1C 12.1 (A) 06/28/2019   HGBA1C 8.4 (H) 11/09/2016   HGBA1C 9.0 (H) 08/17/2012   HGBA1C 7.8 (H) 04/27/2012  She was tested for insulin deficiency >> she did have a low C-peptide (no records)  Previously on: She was on NovoLog 70/30 40 units 2x a day. She tried Metformin IR and ER >> significant diarrhea.   We changed her regimen to: - Tresiba 20 >> 28 >> 20 units daily - Ozempic 0.5 mg weekly in a.m.  Pt is checking sugars 1-2 times a day -he did not write in her log the sugars generally during the day but per her report, they are approximately the same as in the morning: - am: n/c >> 83-124, 135 >> 76, 89, 97-126, 148, 153 - 2h after b'fast: n/c - before lunch: n/c  - 2h after lunch: n/c - before  dinner: n/c >> 108 >> n/c - 2h after dinner: n/c >> 120 >> n/c - bedtime: n/c >> 108, 111, 158 >> n/c - nighttime: n/c Lowest sugar was 90s >> 83 >> 76; she has hypoglycemia awareness at 80.  Highest sugar was 380 >> 158 >> 153.  Glucometer: One Touch Verio  Pt's meals are: - Breakfast: cereals, eggs, sausage, toast, pancakes (dinner for her) - Lunch: soup, sub, chicken, salad - Dinner:chicken, fish, fries - Snacks: 3-4 She tried Keto diet for 3 mo >> lost weight >> could come off insulin. She works night shift: 8 pm-8 am. Before the pandemic, she was exercising (spin classes) but then stopped.  She did restart this year, and also doing yoga.  No CKD, last BUN/creatinine:  Lab Results  Component Value Date   BUN 10 08/14/2019   BUN 6 (L) 10/02/2018   CREATININE 0.58 08/14/2019   CREATININE 0.58 10/02/2018  Not on ACE inhibitor.  + HL; last set of lipids: Lab Results  Component Value Date   CHOL 188 08/14/2019   HDL 40.00 08/14/2019   LDLCALC 129 (H) 08/14/2019   TRIG 93.0 08/14/2019   CHOLHDL 5 08/14/2019  Not on a statin.  - last eye exam was in 08/2019: Reportedly no DR  - Dr. Dorcas Carrow.  - + numbness and tingling in her right thigh, but not in her feet  Pt has FH of DM in 2 uncles.  She has a history  of HIV infection, for which she is seeing Dr. Linus Salmons  Her mother was diagnosed with stage IV breast cancer.  ROS: Constitutional: no weight gain/no weight loss, no fatigue, no subjective hyperthermia, no subjective hypothermia Eyes: no blurry vision, no xerophthalmia ENT: no sore throat, no nodules palpated in neck, no dysphagia, no odynophagia, no hoarseness Cardiovascular: no CP/no SOB/no palpitations/no leg swelling Respiratory: no cough/no SOB/no wheezing Gastrointestinal: no N/no V/no D/no C/+ acid reflux Musculoskeletal: no muscle aches/no joint aches Skin: no rashes, no hair loss Neurological: no tremors/no numbness/no tingling/no dizziness  I reviewed  pt's medications, allergies, PMH, social hx, family hx, and changes were documented in the history of present illness. Otherwise, unchanged from my initial visit note.  Past Medical History:  Diagnosis Date  . Anemia   . History of anemia   . HIV positive ([place])   . Hydradenitis 02/2013   right axilla  . Non-insulin dependent type 2 diabetes mellitus ([place])    gestational only per patient   Past Surgical History:  Procedure Laterality Date  . CHOLECYSTECTOMY  1999  . HYDRADENITIS EXCISION Right 02/14/2013   Procedure: WIDE EXCISION HIDRADENITIS RIGHT AXILLA;  Surgeon: Harl Bowie, MD;  Location: [place];  Service: General;  Laterality: Right;  . TUBAL LIGATION  11/22/2005  . WISDOM TOOTH EXTRACTION     Social History   Socioeconomic History  . Marital status: Divorced    Spouse name: Not on file  . Number of children: 3: 21, 20, 13 - 06/2019  . Years of education: 83  . Highest education level: Not on file  Occupational History   RN  hospice   Tobacco Use  . Smoking status: Current Every Day Smoker    Packs/day: 0.50    Years: 7.00    Pack years: 3.50    Types: Cigarettes  . Smokeless tobacco: Never Used  Substance and Sexual Activity  . Alcohol use: No    Alcohol/week: 0.0 standard drinks  . Drug use: No  . Sexual activity: Yes    Partners: Male    Birth control/protection: Condom    Comment: pt. declined condoms  Other Topics Concern  . Not on file  Social History Narrative  . Not on file   Social Determinants of Health   Financial Resource Strain:   . Difficulty of Paying Living Expenses:   Food Insecurity:   . Worried About Charity fundraiser in the Last Year:   . Arboriculturist in the Last Year:   Transportation Needs:   . Film/video editor (Medical):   Marland Kitchen Lack of Transportation (Non-Medical):   Physical Activity:   . Days of Exercise per Week:   . Minutes of Exercise per Session:   Stress:   . Feeling of Stress :    Social Connections:   . Frequency of Communication with Friends and Family:   . Frequency of Social Gatherings with Friends and Family:   . Attends Religious Services:   . Active Member of Clubs or Organizations:   . Attends Archivist Meetings:   Marland Kitchen Marital Status:   Intimate Partner Violence:   . Fear of Current or Ex-Partner:   . Emotionally Abused:   Marland Kitchen Physically Abused:   . Sexually Abused:    Current Outpatient Medications on File Prior to Visit  Medication Sig Dispense Refill  . Blood Glucose Monitoring Suppl (BLOOD GLUCOSE METER) kit Use as instructed 1 each 0  . elvitegravir-cobicistat-emtricitabine-tenofovir (GENVOYA) 150-150-200-10  MG TABS tablet Take 1 tablet by mouth daily. 30 tablet 11  . gabapentin (NEURONTIN) 300 MG capsule Take 1 pill 3 times a day 90 capsule 11  . glucose blood (ONETOUCH VERIO) test strip Use as instructed 3x a day 200 each 3  . insulin degludec (TRESIBA FLEXTOUCH) 200 UNIT/ML FlexTouch Pen Inject 20 Units into the skin daily. (Patient taking differently: Inject 28 Units into the skin daily. ) 6 pen 3  . Insulin Pen Needle 32G X 4 MM MISC Use 1x a day 100 each 3  . OneTouch Delica Lancets 32K MISC Check sugars 3x a day 200 each 3  . Semaglutide,0.25 or 0.'5MG'$ /DOS, (OZEMPIC, 0.25 OR 0.5 MG/DOSE,) 2 MG/1.5ML SOPN Inject 0.5 mg into the skin once a week. 2 pen 5   No current facility-administered medications on file prior to visit.   Allergies  Allergen Reactions  . Shellfish Allergy Shortness Of Breath  . Hydrocodone Nausea And Vomiting   Family History  Problem Relation Age of Onset  . Cancer Maternal Grandfather   . Diabetes Maternal Uncle   . Cancer Maternal Grandmother     PE: BP 116/70   Pulse 97   Ht '5\' 3"'$  (1.6 m)   Wt 203 lb (92.1 kg)   SpO2 98%   BMI 35.96 kg/m  Wt Readings from Last 3 Encounters:  10/16/19 203 lb (92.1 kg)  08/14/19 204 lb (92.5 kg)  08/06/19 206 lb (93.4 kg)   Constitutional: overweight, in  NAD Eyes: PERRLA, EOMI, no exophthalmos ENT: moist mucous membranes, no thyromegaly, no cervical lymphadenopathy Cardiovascular: RRR, No MRG Respiratory: CTA B Gastrointestinal: abdomen soft, NT, ND, BS+ Musculoskeletal: no deformities, strength intact in all 4 Skin: moist, warm, no rashes Neurological: no tremor with outstretched hands, DTR normal in all 4  ASSESSMENT: 1. DM, insulin-dependent, uncontrolled, with complications - PN  We r/o type 1 diabetes: Component     Latest Ref Rng & Units 08/14/2019  C-Peptide     0.80 - 3.85 ng/mL 1.09  Glucose, Plasma     65 - 99 mg/dL 93  Islet Cell Ab     Neg:<1:1 Negative  ZNT8 Antibodies     <15 U/mL <10  Glutamic Acid Decarb Ab     <5 IU/mL <5   2. HL  3.  Obesity class I  PLAN:  1. Patient with longstanding, uncontrolled, type 2 diabetes (we ruled out type 1 diabetes at last visit), off antidiabetic medications in 06/2019, with an HbA1c extremely high, at 12.1%.  At that time, she was overwhelmed by her diabetes.  We had to start long-acting insulin and weekly GLP-1 receptor agonist, which she continues today.  We also discussed about improving her diet, especially since she works night shift and has a reversed circadian rhythm.  She was snacking throughout the night, which I advised her to stop.  I also referred her to nutrition.  Since then, at last visit, sugars are much better, mostly in the 80s to 90s in the morning and also at goal later in the day.  She definitely benefited from seeing the nutritionist.  Therefore, we decreased the dose of [place] and we continued her Ozempic at last visit. -At this visit, sugars are slightly higher in the morning in the last month, but still at goal with few exceptions.  She had 2 or 3 elevated CBGs in the morning, in the 140s or 150s, however, this may be related to her recent wedding.  She occasionally checks sugars  later in the day, also and she reports that these are approximately similar to  the ones in the morning.  I did advise her to write them down in her log, since here, as of now, her log only contains morning sugars. -We do not need to change her regimen at this time, but at next visit, if she continues to improve, we may decrease her Tresiba dose even further. - I suggested to:  Patient Instructions  Please continue: - Tresiba 20 units daily - Ozempic 0.5 mg weekly in a.m.  Please return in 3-4 months with your sugar log.   - we checked her HbA1c: 6.4% (significant decrease!) - advised to check sugars at different times of the day - 1x a day, rotating check times - advised for yearly eye exams >> she is UTD - return to clinic in 3-4 months  2. HL -Reviewed latest lipid panel from 08/2019: LDL above target, the rest of the fractions at goal: Lab Results  Component Value Date   CHOL 188 08/14/2019   HDL 40.00 08/14/2019   LDLCALC 129 (H) 08/14/2019   TRIG 93.0 08/14/2019   CHOLHDL 5 08/14/2019  -She is not on a statin -At last visit we discussed that improving her diabetes control will most likely lead to improvement in her lipid fractions  3.  Obesity class I -We will continue Ozempic which should also help with weight loss.  At last visit, we also decreased her insulin dose, which should also help -Before last visit, she lost 5 pounds, now lost another lbs  Philemon Kingdom, MD PhD [facility]

## 2019-10-16 NOTE — Patient Instructions (Signed)
Please continue: - Tresiba 20 units daily - Ozempic 0.5 mg weekly in a.m.  Please return in 3-4 months with your sugar log.

## 2019-10-24 ENCOUNTER — Other Ambulatory Visit: Payer: Self-pay | Admitting: Internal Medicine

## 2019-10-24 DIAGNOSIS — G63 Polyneuropathy in diseases classified elsewhere: Secondary | ICD-10-CM

## 2019-10-24 DIAGNOSIS — B20 Human immunodeficiency virus [HIV] disease: Secondary | ICD-10-CM

## 2019-11-13 ENCOUNTER — Telehealth: Payer: Self-pay | Admitting: *Deleted

## 2019-11-13 DIAGNOSIS — Z113 Encounter for screening for infections with a predominantly sexual mode of transmission: Secondary | ICD-10-CM

## 2019-11-13 DIAGNOSIS — B20 Human immunodeficiency virus [HIV] disease: Secondary | ICD-10-CM

## 2019-11-13 MED ORDER — GENVOYA 150-150-200-10 MG PO TABS: 1.0000 | ORAL_TABLET | Freq: Every day | ORAL | 1 refills | Status: DC

## 2019-11-13 NOTE — Telephone Encounter (Signed)
Patient called, stating she needs her Gen[place] to be sent to specialty pharmacy in lieu of local.  RN spoke with patient, scheduled her follow up appointments (she was overdue), sent prescription to [facility] in [place], [place]. Patient will call them to set up her account, will give Specialty her copay card information. Lab orders placed for her annual follow up. Andree Coss, RN

## 2019-11-14 DIAGNOSIS — H16291 Other keratoconjunctivitis, right eye: Secondary | ICD-10-CM | POA: Diagnosis not present

## 2019-12-03 ENCOUNTER — Other Ambulatory Visit: Payer: Self-pay

## 2019-12-03 ENCOUNTER — Other Ambulatory Visit: Payer: 59

## 2019-12-03 DIAGNOSIS — Z113 Encounter for screening for infections with a predominantly sexual mode of transmission: Secondary | ICD-10-CM

## 2019-12-03 DIAGNOSIS — Z79899 Other long term (current) drug therapy: Secondary | ICD-10-CM

## 2019-12-03 DIAGNOSIS — B20 Human immunodeficiency virus [HIV] disease: Secondary | ICD-10-CM

## 2019-12-03 DIAGNOSIS — R69 Illness, unspecified: Secondary | ICD-10-CM | POA: Diagnosis not present

## 2019-12-04 LAB — T-HELPER CELL (CD4) - (RCID CLINIC ONLY)
CD4 % Helper T Cell: 27 % — ABNORMAL LOW (ref 33–65)
CD4 T Cell Abs: 855 /uL (ref 400–1790)

## 2019-12-06 LAB — CBC WITH DIFFERENTIAL/PLATELET
Absolute Monocytes: 475 cells/uL (ref 200–950)
Basophils Absolute: 44 cells/uL (ref 0–200)
Basophils Relative: 0.6 %
Eosinophils Absolute: 80 cells/uL (ref 15–500)
Eosinophils Relative: 1.1 %
HCT: 37 % (ref 35.0–45.0)
Hemoglobin: 12 g/dL (ref 11.7–15.5)
Lymphs Abs: 3431 cells/uL (ref 850–3900)
MCH: 26.6 pg — ABNORMAL LOW (ref 27.0–33.0)
MCHC: 32.4 g/dL (ref 32.0–36.0)
MCV: 82 fL (ref 80.0–100.0)
MPV: 9.8 fL (ref 7.5–12.5)
Monocytes Relative: 6.5 %
Neutro Abs: 3270 cells/uL (ref 1500–7800)
Neutrophils Relative %: 44.8 %
Platelets: 410 10*3/uL — ABNORMAL HIGH (ref 140–400)
RBC: 4.51 10*6/uL (ref 3.80–5.10)
RDW: 14.2 % (ref 11.0–15.0)
Total Lymphocyte: 47 %
WBC: 7.3 10*3/uL (ref 3.8–10.8)

## 2019-12-06 LAB — COMPLETE METABOLIC PANEL WITH GFR
AG Ratio: 1.4 (calc) (ref 1.0–2.5)
ALT: 9 U/L (ref 6–29)
AST: 12 U/L (ref 10–30)
Albumin: 4 g/dL (ref 3.6–5.1)
Alkaline phosphatase (APISO): 71 U/L (ref 31–125)
BUN: 8 mg/dL (ref 7–25)
CO2: 25 mmol/L (ref 20–32)
Calcium: 9.4 mg/dL (ref 8.6–10.2)
Chloride: 102 mmol/L (ref 98–110)
Creat: 0.6 mg/dL (ref 0.50–1.10)
GFR, Est African American: 131 mL/min/{1.73_m2} (ref >=60)
GFR, Est Non African American: 113 mL/min/{1.73_m2} (ref >=60)
Globulin: 2.8 g/dL (calc) (ref 1.9–3.7)
Glucose, Bld: 133 mg/dL — ABNORMAL HIGH (ref 65–99)
Potassium: 4 mmol/L (ref 3.5–5.3)
Sodium: 135 mmol/L (ref 135–146)
Total Bilirubin: 0.3 mg/dL (ref 0.2–1.2)
Total Protein: 6.8 g/dL (ref 6.1–8.1)

## 2019-12-06 LAB — HIV-1 RNA QUANT-NO REFLEX-BLD
HIV 1 RNA Quant: <20 Copies/mL — ABNORMAL HIGH
HIV-1 RNA Quant, Log: <1.3 Log cps/mL — ABNORMAL HIGH

## 2019-12-06 LAB — LIPID PANEL
Cholesterol: 221 mg/dL — ABNORMAL HIGH (ref <200)
HDL: 52 mg/dL (ref >=50)
LDL Cholesterol (Calc): 143 mg/dL (calc) — ABNORMAL HIGH
Non-HDL Cholesterol (Calc): 169 mg/dL (calc) — ABNORMAL HIGH (ref <130)
Total CHOL/HDL Ratio: 4.3 (calc) (ref <5.0)
Triglycerides: 140 mg/dL (ref <150)

## 2019-12-06 LAB — RPR: RPR Ser Ql: NONREACTIVE

## 2019-12-17 ENCOUNTER — Other Ambulatory Visit: Payer: Self-pay

## 2019-12-17 ENCOUNTER — Encounter: Payer: Self-pay | Admitting: Internal Medicine

## 2019-12-17 ENCOUNTER — Ambulatory Visit (INDEPENDENT_AMBULATORY_CARE_PROVIDER_SITE_OTHER): Payer: 59 | Admitting: Internal Medicine

## 2019-12-17 VITALS — BP 117/78 | HR 91 | Wt 209.0 lb

## 2019-12-17 DIAGNOSIS — Z113 Encounter for screening for infections with a predominantly sexual mode of transmission: Secondary | ICD-10-CM

## 2019-12-17 DIAGNOSIS — E782 Mixed hyperlipidemia: Secondary | ICD-10-CM | POA: Diagnosis not present

## 2019-12-17 DIAGNOSIS — B20 Human immunodeficiency virus [HIV] disease: Secondary | ICD-10-CM | POA: Diagnosis not present

## 2019-12-17 DIAGNOSIS — R69 Illness, unspecified: Secondary | ICD-10-CM | POA: Diagnosis not present

## 2019-12-17 DIAGNOSIS — Z23 Encounter for immunization: Secondary | ICD-10-CM | POA: Diagnosis not present

## 2019-12-17 DIAGNOSIS — G63 Polyneuropathy in diseases classified elsewhere: Secondary | ICD-10-CM

## 2019-12-17 MED ORDER — GENVOYA 150-150-200-10 MG PO TABS: 1.0000 | ORAL_TABLET | Freq: Every day | ORAL | 11 refills | Status: DC

## 2019-12-17 MED ORDER — GABAPENTIN 300 MG PO CAPS: ORAL_CAPSULE | ORAL | 11 refills | Status: DC

## 2019-12-17 NOTE — Assessment & Plan Note (Signed)
Screened negative 

## 2019-12-17 NOTE — Progress Notes (Signed)
   Subjective:    Patient ID: Melissa Cruz, female    DOB: 1980-05-16, 39 y.o.   MRN: [id number]  HPI Here for follow up of HIV She is here for her yearly followup.  Since her last visit she has established with Dr. Elvera Lennox and brought her HgbA1c down from 12 to 6.  She is very pleased with the care from Dr. Elvera Lennox.  She was recently married two months ago.  2 children at home and one in college.  She continues to take care of her mother.  No complaints today.    Review of Systems  Constitutional: Negative for unexpected weight change.  Gastrointestinal: Negative for diarrhea and nausea.  Skin: Negative for rash.       Objective:   Physical Exam Constitutional:      Appearance: Normal appearance.  Eyes:     General: No scleral icterus. Pulmonary:     Effort: Pulmonary effort is normal.  Skin:    Findings: No rash.  Neurological:     General: No focal deficit present.     Mental Status: She is alert.  Psychiatric:        Mood and Affect: Mood normal.    SH: + tobacco       Assessment & Plan:

## 2019-12-17 NOTE — Assessment & Plan Note (Signed)
I will refill her gabapentin

## 2019-12-17 NOTE — Assessment & Plan Note (Signed)
This is monitored by Dr. Elvera Lennox.  I will defer lipid panel checks and management to her.

## 2019-12-17 NOTE — Assessment & Plan Note (Addendum)
She continues to do well with her Biktarvy and no concerns.  She can rtc in 1 year.  Will also get her established with our counselor with her life stressors, particularly taking care of her mother.

## 2019-12-18 ENCOUNTER — Other Ambulatory Visit: Payer: Self-pay | Admitting: Internal Medicine

## 2019-12-18 DIAGNOSIS — G63 Polyneuropathy in diseases classified elsewhere: Secondary | ICD-10-CM

## 2020-01-04 ENCOUNTER — Other Ambulatory Visit: Payer: Self-pay | Admitting: Internal Medicine

## 2020-01-28 ENCOUNTER — Other Ambulatory Visit: Payer: Self-pay

## 2020-01-28 ENCOUNTER — Ambulatory Visit (INDEPENDENT_AMBULATORY_CARE_PROVIDER_SITE_OTHER): Payer: 59 | Admitting: Internal Medicine

## 2020-01-28 ENCOUNTER — Encounter: Payer: Self-pay | Admitting: Internal Medicine

## 2020-01-28 VITALS — BP 112/78 | HR 97 | Ht 63.0 in | Wt 210.4 lb

## 2020-01-28 DIAGNOSIS — E1165 Type 2 diabetes mellitus with hyperglycemia: Secondary | ICD-10-CM | POA: Diagnosis not present

## 2020-01-28 DIAGNOSIS — E669 Obesity, unspecified: Secondary | ICD-10-CM

## 2020-01-28 DIAGNOSIS — E1142 Type 2 diabetes mellitus with diabetic polyneuropathy: Secondary | ICD-10-CM

## 2020-01-28 DIAGNOSIS — E782 Mixed hyperlipidemia: Secondary | ICD-10-CM | POA: Diagnosis not present

## 2020-01-28 DIAGNOSIS — IMO0002 Reserved for concepts with insufficient information to code with codable children: Secondary | ICD-10-CM

## 2020-01-28 LAB — POCT GLYCOSYLATED HEMOGLOBIN (HGB A1C): Hemoglobin A1C: 6.6 % — AB (ref 4.0–5.6)

## 2020-01-28 MED ORDER — OZEMPIC (0.25 OR 0.5 MG/DOSE) 2 MG/1.5ML ~~LOC~~ SOPN
0.5000 mg | PEN_INJECTOR | SUBCUTANEOUS | 3 refills | Status: DC
Start: 2020-01-28 — End: 2020-07-07

## 2020-01-28 MED ORDER — PRAVASTATIN SODIUM 20 MG PO TABS: 20.0000 mg | ORAL_TABLET | Freq: Every day | ORAL | 3 refills | Status: DC

## 2020-01-28 NOTE — Patient Instructions (Addendum)
Please continue: - Tresiba 20 units daily - Ozempic 0.5 mg weekly in a.m.  Please return in 4 months with your sugar log.   

## 2020-01-28 NOTE — Addendum Note (Signed)
Addended by: Darene Lamer T on: 01/28/2020 11:36 AM   Modules accepted: Orders

## 2020-01-28 NOTE — Progress Notes (Addendum)
Patient ID: Melissa Cruz, female   DOB: 12/08/1980, 39 y.o.   MRN: [id number]   This visit occurred during the SARS-CoV-2 public health emergency.  Safety protocols were in place, including screening questions prior to the visit, additional usage of staff PPE, and extensive cleaning of exam room while observing appropriate contact time as indicated for disinfecting solutions.   HPI: Melissa Cruz is a 39 y.o.-year-old female, initially referred by Dr. Linus Salmons, returning for follow-up for DM, dx in 2014, GDM in ~2008 insulin-dependent since 2018, uncontrolled, with long-term complications (PN).  She did see endocrinology in the past (Dr. Chalmers Cater), but I do not have these records.  Last visit 3 months ago.  In 06/2019, she was not on any antidiabetic medications, came off her insulin when she started a keto diet, lost weight, and her sugars improved.  However, during the coronavirus pandemic, sugars worsened again and her weight increased.  She was not checking sugars and was feeling poorly.  An HbA1c returned very high.  However, since then, sugars improved significantly.  She got married right before last visit.  Reviewed HbA1c levels: Lab Results  Component Value Date   HGBA1C 6.4 (A) 10/16/2019   HGBA1C 12.1 (A) 06/28/2019   HGBA1C 8.4 (H) 11/09/2016   HGBA1C 9.0 (H) 08/17/2012   HGBA1C 7.8 (H) 04/27/2012  She was tested for insulin deficiency >> she did have a low C-peptide (no records)  Previously on: She was on NovoLog 70/30 40 units 2x a day. She tried Metformin IR and ER >> significant diarrhea.   We changed her regimen to: - Tresiba 20 >> 28 >> 20 units daily - Ozempic 0.5 mg weekly in a.m.  At last visit, she was checking sugars 1-2 times a day, but now she checks them seldom: - am: n/c >> 83-124, 135 >> 76, 89, 97-126, 148, 153 >> 95-112 - 2h after b'fast: n/c - before lunch: n/c  - 2h after lunch: n/c - before dinner: n/c >> 108 >> n/c >> 95-112 - 2h after  dinner: n/c >> 120 >> n/c - bedtime: n/c >> 108, 111, 158 >> n/c - nighttime: n/c Lowest sugar was 90s >> 83 >> 76 >> 95; she has hypoglycemia awareness in the 80s.  Highest sugar was 380 >> 158 >> 153 >> 128.  Glucometer: One Touch Verio  Pt's meals are: - Breakfast: cereals, eggs, sausage, toast, pancakes (dinner for her) - Lunch: soup, sub, chicken, salad - Dinner:chicken, fish, fries - Snacks: 3-4 She tried Keto diet for 3 mo >> lost weight >> could come off insulin. She works night shift: 8 pm-8 am. Before the pandemic, she was exercising (spin classes) but then stopped.  She did restart earlier this year and also doing yoga, now doing them less consistently.  No CKD, last BUN/creatinine:  Lab Results  Component Value Date   BUN 8 12/03/2019   BUN 10 08/14/2019   CREATININE 0.60 12/03/2019   CREATININE 0.58 08/14/2019  Not on ACE inhibitor.  + HL; last set of lipids: Lab Results  Component Value Date   CHOL 221 (H) 12/03/2019   HDL 52 12/03/2019   LDLCALC 143 (H) 12/03/2019   TRIG 140 12/03/2019   CHOLHDL 4.3 12/03/2019  Not on a statin.  - last eye exam was in 08/2019: Reportedly no DR- Dr. Dorcas Carrow.  - No numbness and tingling in her feet  Pt has FH of DM in 2 uncles.  She has a history of HIV infection, for  which she is seeing Dr. Linus Salmons  Her mother was diagnosed with stage IV breast cancer before last visit.  ROS: Constitutional: + weight gain/no weight loss, no fatigue, no subjective hyperthermia, no subjective hypothermia Eyes: no blurry vision, no xerophthalmia ENT: no sore throat, no nodules palpated in neck, no dysphagia, no odynophagia, no hoarseness Cardiovascular: no CP/no SOB/no palpitations/no leg swelling Respiratory: no cough/no SOB/no wheezing Gastrointestinal: no N/no V/no D/+ C/+ acid reflux Musculoskeletal: no muscle aches/no joint aches Skin: no rashes, no hair loss Neurological: no tremors/no numbness/no tingling/no dizziness  I  reviewed pt's medications, allergies, PMH, social hx, family hx, and changes were documented in the history of present illness. Otherwise, unchanged from my initial visit note.  Past Medical History:  Diagnosis Date  . Anemia   . History of anemia   . HIV positive ([place])   . Hydradenitis 02/2013   right axilla  . Non-insulin dependent type 2 diabetes mellitus ([place])    gestational only per patient   Past Surgical History:  Procedure Laterality Date  . CHOLECYSTECTOMY  1999  . HYDRADENITIS EXCISION Right 02/14/2013   Procedure: WIDE EXCISION HIDRADENITIS RIGHT AXILLA;  Surgeon: Harl Bowie, MD;  Location: [facility];  Service: General;  Laterality: Right;  . TUBAL LIGATION  11/22/2005  . WISDOM TOOTH EXTRACTION     Social History   Socioeconomic History  . Marital status: Divorced    Spouse name: Not on file  . Number of children: 3: 21, 20, 13 - 06/2019  . Years of education: 65  . Highest education level: Not on file  Occupational History   RN  hospice   Tobacco Use  . Smoking status: Current Every Day Smoker    Packs/day: 0.50    Years: 7.00    Pack years: 3.50    Types: Cigarettes  . Smokeless tobacco: Never Used  Substance and Sexual Activity  . Alcohol use: No    Alcohol/week: 0.0 standard drinks  . Drug use: No  . Sexual activity: Yes    Partners: Male    Birth control/protection: Condom    Comment: pt. declined condoms  Other Topics Concern  . Not on file  Social History Narrative  . Not on file   Social Determinants of Health   Financial Resource Strain:   . Difficulty of Paying Living Expenses:   Food Insecurity:   . Worried About Charity fundraiser in the Last Year:   . Arboriculturist in the Last Year:   Transportation Needs:   . Film/video editor (Medical):   Marland Kitchen Lack of Transportation (Non-Medical):   Physical Activity:   . Days of Exercise per Week:   . Minutes of Exercise per Session:   Stress:   . Feeling of Stress  :   Social Connections:   . Frequency of Communication with Friends and Family:   . Frequency of Social Gatherings with Friends and Family:   . Attends Religious Services:   . Active Member of Clubs or Organizations:   . Attends Archivist Meetings:   Marland Kitchen Marital Status:   Intimate Partner Violence:   . Fear of Current or Ex-Partner:   . Emotionally Abused:   Marland Kitchen Physically Abused:   . Sexually Abused:    Current Outpatient Medications on File Prior to Visit  Medication Sig Dispense Refill  . Blood Glucose Monitoring Suppl (BLOOD GLUCOSE METER) kit Use as instructed 1 each 0  . elvitegravir-cobicistat-emtricitabine-tenofovir (GENVOYA) 150-150-200-10 MG  TABS tablet Take 1 tablet by mouth daily. 30 tablet 11  . gabapentin (NEURONTIN) 300 MG capsule TAKE 1 CAPSULE 3 TIMES A   DAY 270 capsule 3  . glucose blood (ONETOUCH VERIO) test strip Use as instructed 3x a day 200 each 3  . insulin degludec (TRESIBA FLEXTOUCH) 200 UNIT/ML FlexTouch Pen Inject 20 Units into the skin daily. 6 pen 3  . Insulin Pen Needle 32G X 4 MM MISC Use 1x a day 100 each 3  . OneTouch Delica Lancets 57D MISC Check sugars 3x a day 200 each 3  . OZEMPIC, 0.25 OR 0.5 MG/DOSE, 2 MG/1.5ML SOPN INJECT 0.5 MG INTO THE SKIN ONCE A WEEK. 4.5 mL 0   No current facility-administered medications on file prior to visit.   Allergies  Allergen Reactions  . Shellfish Allergy Shortness Of Breath  . Hydrocodone Nausea And Vomiting   Family History  Problem Relation Age of Onset  . Cancer Maternal Grandfather   . Diabetes Maternal Uncle   . Cancer Maternal Grandmother     PE: BP 112/78   Pulse 97   Ht $R'5\' 3"'FG$  (1.6 m)   Wt 210 lb 6.4 oz (95.4 kg)   SpO2 99%   BMI 37.27 kg/m  Wt Readings from Last 3 Encounters:  01/28/20 210 lb 6.4 oz (95.4 kg)  12/17/19 209 lb (94.8 kg)  10/16/19 203 lb (92.1 kg)   Constitutional: overweight, in NAD Eyes: PERRLA, EOMI, no exophthalmos ENT: moist mucous membranes, no  thyromegaly, no cervical lymphadenopathy Cardiovascular: tachycardia, RR, No MRG Respiratory: CTA B Gastrointestinal: abdomen soft, NT, ND, BS+ Musculoskeletal: no deformities, strength intact in all 4 Skin: moist, warm, no rashes Neurological: no tremor with outstretched hands, DTR normal in all 4  ASSESSMENT: 1. DM, insulin-dependent, uncontrolled, with complications - PN  We r/o type 1 diabetes: Component     Latest Ref Rng & Units 08/14/2019  C-Peptide     0.80 - 3.85 ng/mL 1.09  Glucose, Plasma     65 - 99 mg/dL 93  Islet Cell Ab     Neg:<1:1 Negative  ZNT8 Antibodies     <15 U/mL <10  Glutamic Acid Decarb Ab     <5 IU/mL <5   2. HL  3.  Obesity class I  PLAN:  1. Patient with longstanding, uncontrolled, type 2 diabetes (we ruled out type 1 diabetes), off antidiabetic medications in 06/2019, with an extremely high HbA1c level.  At that time, she was overwhelmed by her diabetes.  We had to start long-acting insulin and weekly GLP-1 receptor agonist, after which, her blood sugars improved.  We also discussed about improving her diet and starting exercise.  At last visit, sugars were slightly higher in the morning and the previous month, but they were much improved compared to before.  We did not change the regimen at that time.  HbA1c was dramatically improved, to 6.4%. -At today's visit, she did not check many sugars, but whenever she checked, they were at goal. -We did discuss about potentially increasing Ozempic while decreasing if the Tresiba dose but she does have some GI side effects from [place] (not too bothersome: constipation, gas) so we will not do this at the moment. -I did advise her to check sugars every day, though - I suggested to:  Patient Instructions  Please continue: - Tresiba 20 units daily - Ozempic 0.5 mg weekly in a.m.  Please return in 4 months with your sugar log.   - we checked  her HbA1c: 6.6% (slightly higher) - advised to check sugars at  different times of the day - 1x a day, rotating check times - advised for yearly eye exams >> she is UTD - return to clinic in 4 months  2. HL -Reviewed latest lipid panel from 11/2019: LDL above goal, the rest of the fractions at goal: Lab Results  Component Value Date   CHOL 221 (H) 12/03/2019   HDL 52 12/03/2019   LDLCALC 143 (H) 12/03/2019   TRIG 140 12/03/2019   CHOLHDL 4.3 12/03/2019  -She is not on a statin. -At this visit, I suggested pravastatin >> discussed about benefits >> she agrees to start this (20 mg daily)  3.  Obesity class I -We will continue Ozempic which should also help with weight loss -At today's visit, weight is 7 pounds higher -We discussed about backing off the insulin does not increase his Ozempic, but she currently has some constipation and gas with Ozempic (discussed about using MiraLAX) so we will hold off doing so. -She is interested in intermittent fasting and we discussed about the best window for fasting -She also plans to start exercising more consistently  Philemon Kingdom, MD PhD [facility] Endocrinology

## 2020-05-30 ENCOUNTER — Other Ambulatory Visit: Payer: Self-pay

## 2020-06-03 ENCOUNTER — Ambulatory Visit (INDEPENDENT_AMBULATORY_CARE_PROVIDER_SITE_OTHER): Payer: 59 | Admitting: Internal Medicine

## 2020-06-03 ENCOUNTER — Other Ambulatory Visit: Payer: Self-pay

## 2020-06-03 ENCOUNTER — Encounter: Payer: Self-pay | Admitting: Internal Medicine

## 2020-06-03 VITALS — BP 120/74 | HR 97 | Ht 63.0 in | Wt 209.8 lb

## 2020-06-03 DIAGNOSIS — E1165 Type 2 diabetes mellitus with hyperglycemia: Secondary | ICD-10-CM

## 2020-06-03 DIAGNOSIS — E782 Mixed hyperlipidemia: Secondary | ICD-10-CM

## 2020-06-03 DIAGNOSIS — IMO0002 Reserved for concepts with insufficient information to code with codable children: Secondary | ICD-10-CM

## 2020-06-03 DIAGNOSIS — E669 Obesity, unspecified: Secondary | ICD-10-CM

## 2020-06-03 DIAGNOSIS — E1142 Type 2 diabetes mellitus with diabetic polyneuropathy: Secondary | ICD-10-CM

## 2020-06-03 LAB — POCT GLYCOSYLATED HEMOGLOBIN (HGB A1C): Hemoglobin A1C: 6.6 % — AB (ref 4.0–5.6)

## 2020-06-03 MED ORDER — TRESIBA FLEXTOUCH 200 UNIT/ML ~~LOC~~ SOPN: 20.0000 [IU] | PEN_INJECTOR | Freq: Every day | SUBCUTANEOUS | 3 refills | Status: DC

## 2020-06-03 NOTE — Patient Instructions (Signed)
Please continue: - Tresiba 20 units daily - Ozempic 0.5 mg weekly in a.m.  Please return in 4 months with your sugar log.

## 2020-06-03 NOTE — Progress Notes (Signed)
Patient ID: Melissa Cruz, female   DOB: 1980-11-10, 40 y.o.   MRN: [id number]   This visit occurred during the SARS-CoV-2 public health emergency.  Safety protocols were in place, including screening questions prior to the visit, additional usage of staff PPE, and extensive cleaning of exam room while observing appropriate contact time as indicated for disinfecting solutions.   HPI: Melissa Cruz is a 40 y.o.-year-old female, initially referred by Dr. Linus Salmons, returning for follow-up for DM, dx in 2014, GDM in ~2008 insulin-dependent since 2018, uncontrolled, with long-term complications (PN).  She did see endocrinology in the past (Dr. Chalmers Cater), but I do not have these records.  Last visit 4 months ago.  In 06/2019, she was not on any antidiabetic medications, came off her insulin when she started a keto diet, lost weight, and her sugars improved.  However, during the coronavirus pandemic, sugars worsened again and her weight decreased.  She was not checking sugars and was feeling poorly.  HbA1c returned very high, at 12.1%.  Since then, we started [place] and Ozempic.  Sugars improved significantly afterwards.  Reviewed HbA1c levels: Lab Results  Component Value Date   HGBA1C 6.6 (A) 01/28/2020   HGBA1C 6.4 (A) 10/16/2019   HGBA1C 12.1 (A) 06/28/2019   HGBA1C 8.4 (H) 11/09/2016   HGBA1C 9.0 (H) 08/17/2012   HGBA1C 7.8 (H) 04/27/2012  She was tested for insulin deficiency >> she did have a low C-peptide (no records)  Previously on: She was on NovoLog 70/30 40 units 2x a day. She tried Metformin IR and ER >> significant diarrhea.   We changed her regimen to: - Tresiba 20 >> 28 >> 20 units daily - Ozempic 0.5 mg weekly in a.m.  She is checking sugars 0 to once a day: - am: 76, 89, 97-126, 148, 153 >> 95-112 >> 90-120 - 2h after b'fast: n/c - before lunch: n/c  - 2h after lunch: n/c - before dinner: n/c >> 108 >> n/c >> 95-112 >> 92-131 - 2h after dinner: n/c >> 120 >>  n/c - bedtime: n/c >> 108, 111, 158 >> n/c >> 123 - nighttime: n/c Lowest sugar was 76 >> 95 >> 90; she has hypoglycemia awareness in the 80s. Highest sugar was 380 ... >> 128 >> 145.  Glucometer: One Touch Verio  Pt's meals are: - Breakfast: cereals, eggs, sausage, toast, pancakes (dinner for her) - Lunch: soup, sub, chicken, salad - Dinner:chicken, fish, fries - Snacks: 3-4 She tried Keto diet for 3 mo >> lost weight >> could come off insulin. She works night shift: 8 pm-8 am. Before the pandemic, she was exercising (spin classes) but then stopped.  She did restart last year and also doing yoga.  No CKD, last BUN/creatinine:  Lab Results  Component Value Date   BUN 8 12/03/2019   BUN 10 08/14/2019   CREATININE 0.60 12/03/2019   CREATININE 0.58 08/14/2019  Not on ACE inhibitor/ARB  + HL; last set of lipids: Lab Results  Component Value Date   CHOL 221 (H) 12/03/2019   HDL 52 12/03/2019   LDLCALC 143 (H) 12/03/2019   TRIG 140 12/03/2019   CHOLHDL 4.3 12/03/2019  We started pravastatin 20 mg daily in 01/2020.  - last eye exam was in 08/2019: Reportedly no DR- Dr. Dorcas Carrow.  - no numbness and tingling in her feet  Pt has FH of DM in 2 uncles.  She has a history of HIV infection, for which she is seeing Dr. Linus Salmons  Her  mother was diagnosed with stage IV breast cancer last year.  ROS: Constitutional: no weight gain/no weight loss, no fatigue, no subjective hyperthermia, no subjective hypothermia Eyes: no blurry vision, no xerophthalmia ENT: no sore throat, no nodules palpated in neck, no dysphagia, no odynophagia, no hoarseness Cardiovascular: no CP/no SOB/no palpitations/no leg swelling Respiratory: no cough/no SOB/no wheezing Gastrointestinal: no N/no V/no D/+ C/+ acid reflux Musculoskeletal: no muscle aches/no joint aches Skin: no rashes, no hair loss Neurological: no tremors/no numbness/no tingling/no dizziness  I reviewed pt's medications, allergies, PMH,  social hx, family hx, and changes were documented in the history of present illness. Otherwise, unchanged from my initial visit note.  Past Medical History:  Diagnosis Date  . Anemia   . History of anemia   . HIV positive ([place])   . Hydradenitis 02/2013   right axilla  . Non-insulin dependent type 2 diabetes mellitus ([place])    gestational only per patient   Past Surgical History:  Procedure Laterality Date  . CHOLECYSTECTOMY  1999  . HYDRADENITIS EXCISION Right 02/14/2013   Procedure: WIDE EXCISION HIDRADENITIS RIGHT AXILLA;  Surgeon: Harl Bowie, MD;  Location: [facility];  Service: General;  Laterality: Right;  . TUBAL LIGATION  11/22/2005  . WISDOM TOOTH EXTRACTION     Social History   Socioeconomic History  . Marital status: Divorced    Spouse name: Not on file  . Number of children: 3: 21, 20, 13 - 06/2019  . Years of education: 50  . Highest education level: Not on file  Occupational History   RN  hospice   Tobacco Use  . Smoking status: Current Every Day Smoker    Packs/day: 0.50    Years: 7.00    Pack years: 3.50    Types: Cigarettes  . Smokeless tobacco: Never Used  Substance and Sexual Activity  . Alcohol use: No    Alcohol/week: 0.0 standard drinks  . Drug use: No  . Sexual activity: Yes    Partners: Male    Birth control/protection: Condom    Comment: pt. declined condoms  Other Topics Concern  . Not on file  Social History Narrative  . Not on file   Social Determinants of Health   Financial Resource Strain:   . Difficulty of Paying Living Expenses:   Food Insecurity:   . Worried About Charity fundraiser in the Last Year:   . Arboriculturist in the Last Year:   Transportation Needs:   . Film/video editor (Medical):   Marland Kitchen Lack of Transportation (Non-Medical):   Physical Activity:   . Days of Exercise per Week:   . Minutes of Exercise per Session:   Stress:   . Feeling of Stress :   Social Connections:   . Frequency of  Communication with Friends and Family:   . Frequency of Social Gatherings with Friends and Family:   . Attends Religious Services:   . Active Member of Clubs or Organizations:   . Attends Archivist Meetings:   Marland Kitchen Marital Status:   Intimate Partner Violence:   . Fear of Current or Ex-Partner:   . Emotionally Abused:   Marland Kitchen Physically Abused:   . Sexually Abused:    Current Outpatient Medications on File Prior to Visit  Medication Sig Dispense Refill  . Blood Glucose Monitoring Suppl (BLOOD GLUCOSE METER) kit Use as instructed 1 each 0  . elvitegravir-cobicistat-emtricitabine-tenofovir (GENVOYA) 150-150-200-10 MG TABS tablet Take 1 tablet by mouth daily. [place]  tablet 11  . gabapentin (NEURONTIN) 300 MG capsule TAKE 1 CAPSULE 3 TIMES A   DAY 270 capsule 3  . glucose blood (ONETOUCH VERIO) test strip Use as instructed 3x a day 200 each 3  . insulin degludec (TRESIBA FLEXTOUCH) 200 UNIT/ML FlexTouch Pen Inject 20 Units into the skin daily. 6 pen 3  . Insulin Pen Needle 32G X 4 MM MISC Use 1x a day 100 each 3  . OneTouch Delica Lancets 49F MISC Check sugars 3x a day 200 each 3  . pravastatin (PRAVACHOL) 20 MG tablet Take 1 tablet (20 mg total) by mouth daily. 90 tablet 3  . Semaglutide,0.25 or 0.5MG/DOS, (OZEMPIC, 0.25 OR 0.5 MG/DOSE,) 2 MG/1.5ML SOPN Inject 0.5 mg into the skin once a week. 4.5 mL 3   No current facility-administered medications on file prior to visit.   Allergies  Allergen Reactions  . Shellfish Allergy Shortness Of Breath  . Hydrocodone Nausea And Vomiting   Family History  Problem Relation Age of Onset  . Cancer Maternal Grandfather   . Diabetes Maternal Uncle   . Cancer Maternal Grandmother     PE: BP 120/74   Pulse 97   Ht _0  (1.6 m)   Wt 209 lb 12.8 oz (95.2 kg)   SpO2 98%   BMI 37.16 kg/m  Wt Readings from Last 3 Encounters:  06/03/20 209 lb 12.8 oz (95.2 kg)  01/28/20 210 lb 6.4 oz (95.4 kg)  12/17/19 209 lb (94.8 kg)   Constitutional:  overweight, in NAD Eyes: PERRLA, EOMI, no exophthalmos ENT: moist mucous membranes, no thyromegaly, no cervical lymphadenopathy Cardiovascular: tachycardia, RR, No MRG Respiratory: CTA B Gastrointestinal: abdomen soft, NT, ND, BS+ Musculoskeletal: no deformities, strength intact in all 4 Skin: moist, warm, no rashes Neurological: no tremor with outstretched hands, DTR normal in all 4  ASSESSMENT: 1. DM, insulin-dependent, uncontrolled, with complications - PN  We r/o type 1 diabetes: Component     Latest Ref Rng & Units 08/14/2019  C-Peptide     0.80 - 3.85 ng/mL 1.09  Glucose, Plasma     65 - 99 mg/dL 93  Islet Cell Ab     Neg:<1:1 Negative  ZNT8 Antibodies     <15 U/mL <10  Glutamic Acid Decarb Ab     <5 IU/mL <5   2. HL  3.  Obesity class I  PLAN:  1. Patient with longstanding, uncontrolled,, type 2 diabetes (we ruled out type 1 diabetes), previously off all her diabetic medications in 06/2019, with a very high HbA1c level.  At that time, she described being overwhelmed by her diabetes.  We started long-acting insulin and also GLP-1 receptor agonist and her sugars improved.  We also discussed about improving her diet and starting exercise.  At last visit HbA1c was significantly improved, but she was not checking sugars consistently.  Whenever she checks, though, they were at goal.  We discussed about checking consistently but did not change the regimen.  At that time she was having some constipation and gas from Ozempic. -At today's visit, sugars are still at goal, only slightly higher than before.  She is checking at different times of the day and she has no low blood sugars or significant hyperglycemic spikes.  For now, we will continue the current regimen, but if sugars improve at next visit, we will need to start titrating Tresiba dose down. - I suggested to:  Patient Instructions  Please continue: - Tresiba 20 units daily - Ozempic 0.5  mg weekly in a.m.  Please return  in 4 months with your sugar log.   - we checked her HbA1c: 6.6% (stable) - advised to check sugars at different times of the day - 1x a day, rotating check times - advised for yearly eye exams >> she is UTD - return to clinic in 4 months  2. HL -Reviewed latest lipid panel from 11/2019: LDL above target, the rest of the fractions at goal: Lab Results  Component Value Date   CHOL 221 (H) 12/03/2019   HDL 52 12/03/2019   LDLCALC 143 (H) 12/03/2019   TRIG 140 12/03/2019   CHOLHDL 4.3 12/03/2019  -Restarted pravastatin 20 mg daily at last visit.  She tolerates this well. -We will recheck this at next visit  3.  Obesity class I -We will continue Ozempic which should also help with weight loss -She gained 7 pounds before last visit -We did not increase her Ozempic dose at last visit that she still had some constipation and gastric Ozempic-discussed using MiraLAX -At last visit she was also interested in intermittent fasting and we discussed about the best window for fasting.  She was also planning to start exercising more consistently -She lost 1 pound since last visit  Philemon Kingdom, MD PhD [facility] Endocrinology

## 2020-07-04 ENCOUNTER — Other Ambulatory Visit: Payer: Self-pay | Admitting: Internal Medicine

## 2020-07-04 DIAGNOSIS — IMO0002 Reserved for concepts with insufficient information to code with codable children: Secondary | ICD-10-CM

## 2020-07-04 DIAGNOSIS — E1142 Type 2 diabetes mellitus with diabetic polyneuropathy: Secondary | ICD-10-CM

## 2020-07-04 NOTE — Telephone Encounter (Signed)
T, She was on Ozempic 0.5 mg weekly but I see that it is not covered anymore.  Instead of Bydureon, I would suggest Trulicity 1.5 mg weekly. Ty! C

## 2020-07-04 NOTE — Telephone Encounter (Signed)
Not on current medication list. Please advise.

## 2020-07-07 ENCOUNTER — Other Ambulatory Visit: Payer: Self-pay | Admitting: Internal Medicine

## 2020-07-07 DIAGNOSIS — E1165 Type 2 diabetes mellitus with hyperglycemia: Secondary | ICD-10-CM

## 2020-07-07 DIAGNOSIS — IMO0002 Reserved for concepts with insufficient information to code with codable children: Secondary | ICD-10-CM

## 2020-07-07 DIAGNOSIS — E1142 Type 2 diabetes mellitus with diabetic polyneuropathy: Secondary | ICD-10-CM

## 2020-07-07 NOTE — Telephone Encounter (Signed)
Alternate

## 2020-07-10 NOTE — Telephone Encounter (Signed)
T, Let's send the BCise.  Ty! C

## 2020-07-14 ENCOUNTER — Encounter: Payer: Self-pay | Admitting: Internal Medicine

## 2020-07-15 ENCOUNTER — Other Ambulatory Visit: Payer: Self-pay | Admitting: Internal Medicine

## 2020-07-15 MED ORDER — OZEMPIC (1 MG/DOSE) 4 MG/3ML ~~LOC~~ SOPN: 1.0000 mg | PEN_INJECTOR | SUBCUTANEOUS | 3 refills | Status: DC

## 2020-07-30 ENCOUNTER — Telehealth: Payer: Self-pay

## 2020-07-30 DIAGNOSIS — B20 Human immunodeficiency virus [HIV] disease: Secondary | ICD-10-CM

## 2020-07-30 DIAGNOSIS — G63 Polyneuropathy in diseases classified elsewhere: Secondary | ICD-10-CM

## 2020-07-30 MED ORDER — GENVOYA 150-150-200-10 MG PO TABS: 1.0000 | ORAL_TABLET | Freq: Every day | ORAL | 4 refills | Status: DC

## 2020-07-30 NOTE — Addendum Note (Signed)
Addended by: Linna Hoff D on: 07/30/2020 11:26 AM   Modules accepted: Orders

## 2020-07-30 NOTE — Telephone Encounter (Signed)
Received faxed notice that patient's Melissa Cruz will need to be switched from [facility] to [facility]. Attempted to call patient to confirm pharmacy change, no answer. Left HIPAA compliant voicemail requesting callback.   Sandie Ano, RN

## 2020-07-31 NOTE — Telephone Encounter (Signed)
RN spoke to [facility], they state they have voided patient's account and no longer have gabapentin on file. Will route to provider.   Sandie Ano, RN

## 2020-08-01 MED ORDER — GABAPENTIN 300 MG PO CAPS: ORAL_CAPSULE | ORAL | 3 refills | Status: DC

## 2020-08-01 NOTE — Telephone Encounter (Signed)
Yes, ok to refill with 3 90 day refills for the year thanks

## 2020-08-01 NOTE — Addendum Note (Signed)
Addended by: Linna Hoff D on: 08/01/2020 11:29 AM   Modules accepted: Orders

## 2020-09-30 ENCOUNTER — Other Ambulatory Visit: Payer: Self-pay

## 2020-09-30 ENCOUNTER — Encounter: Payer: Self-pay | Admitting: Internal Medicine

## 2020-09-30 ENCOUNTER — Ambulatory Visit (INDEPENDENT_AMBULATORY_CARE_PROVIDER_SITE_OTHER): Payer: Managed Care, Other (non HMO) | Admitting: Internal Medicine

## 2020-09-30 VITALS — BP 158/90 | HR 94 | Ht 63.0 in | Wt 213.0 lb

## 2020-09-30 DIAGNOSIS — E1165 Type 2 diabetes mellitus with hyperglycemia: Secondary | ICD-10-CM | POA: Diagnosis not present

## 2020-09-30 DIAGNOSIS — E782 Mixed hyperlipidemia: Secondary | ICD-10-CM

## 2020-09-30 DIAGNOSIS — E1142 Type 2 diabetes mellitus with diabetic polyneuropathy: Secondary | ICD-10-CM

## 2020-09-30 DIAGNOSIS — IMO0002 Reserved for concepts with insufficient information to code with codable children: Secondary | ICD-10-CM

## 2020-09-30 DIAGNOSIS — E669 Obesity, unspecified: Secondary | ICD-10-CM

## 2020-09-30 LAB — POCT GLYCOSYLATED HEMOGLOBIN (HGB A1C): Hemoglobin A1C: 7.5 % — AB (ref 4.0–5.6)

## 2020-09-30 MED ORDER — FREESTYLE LIBRE 2 SENSOR MISC: 1.0000 | 3 refills | Status: DC

## 2020-09-30 NOTE — Addendum Note (Signed)
Addended by: Winn [place]ock on: 09/30/2020 03:55 PM   Modules accepted: Orders

## 2020-09-30 NOTE — Patient Instructions (Addendum)
Please continue: - Tresiba 12 units daily - Ozempic 1 mg weekly in a.m.  Please return in 4 months with your sugar log.

## 2020-09-30 NOTE — Progress Notes (Signed)
Patient ID: Melissa Cruz, female   DOB: [birth year], 40 y.o.   MRN: [id number]   This visit occurred during the SARS-CoV-2 public health emergency.  Safety protocols were in place, including screening questions prior to the visit, additional usage of staff PPE, and extensive cleaning of exam room while observing appropriate contact time as indicated for disinfecting solutions.   HPI: Melissa Cruz is a 40 y.o.-year-old female, initially referred by Dr. Linus Salmons, returning for follow-up for DM, dx in 2014, GDM in ~2008 insulin-dependent since 2018, uncontrolled, with long-term complications (PN).  She did see endocrinology in the past (Dr. Chalmers Cater), but I do not have these records.  Last visit 4 months ago.  Interim history: No increased urination, blurry vision, nausea, chest pain. She continues to have constipation. She tells me that she just changed insurance again and she was out of insulin, only started back approximately a month ago.  Sugars are higher, in the 200s, now improved.  She also missed Tresiba doses, approximately twice a week and she is also using a lower dose as she is afraid of dropping too low.  Reviewed HbA1c levels: Lab Results  Component Value Date   HGBA1C 6.6 (A) 06/03/2020   HGBA1C 6.6 (A) 01/28/2020   HGBA1C 6.4 (A) 10/16/2019   HGBA1C 12.1 (A) 06/28/2019   HGBA1C 8.4 (H) 11/09/2016   HGBA1C 9.0 (H) 08/17/2012   HGBA1C 7.8 (H) 04/27/2012  She was tested for insulin deficiency >> she did have a low C-peptide (no records)  Previously on: She was on NovoLog 70/30 40 units 2x a day. She tried Metformin IR and ER >> significant diarrhea.   We changed her regimen to: - Tresiba 20 >> 28 >> 20 >> 12 units daily  - Ozempic 0.5 >> 1 mg weekly in a.m.  She is checking sugars 0 to once a day: - am: 76, 89, 97-126, 148, 153 >> 95-112 >> 90-120 >> 124, 176 - 2h after b'fast: n/c - before lunch: n/c >> 97, 120 - 2h after lunch: n/c - before dinner: n/c >>  95-112 >> 92-131 >> 113-131 - 2h after dinner: n/c >> 120 >> n/c - bedtime: n/c >> 108, 111, 158 >> n/c >> 102-132, 241 - nighttime: n/c Lowest sugar was 76 >> 95 >> 90 >> 102; she has hypoglycemia awareness in the 80s. Highest sugar was 380 ... >> 128 >> 145 >> 241.  Glucometer: One Touch Verio  Pt's meals are: - Breakfast: cereals, eggs, sausage, toast, pancakes (dinner for her) - Lunch: soup, sub, chicken, salad - Dinner:chicken, fish, fries - Snacks: 3-4 She tried Keto diet for 3 mo >> lost weight >> could come off insulin. She works night shift: 8 pm-8 am. Before the pandemic, she was exercising (spin classes) but then stopped.  She did restart last year and also doing yoga.  No CKD, last BUN/creatinine:  Lab Results  Component Value Date   BUN 8 12/03/2019   BUN 10 08/14/2019   CREATININE 0.60 12/03/2019   CREATININE 0.58 08/14/2019  Not on ACE inhibitor/ARB  + HL; last set of lipids: Lab Results  Component Value Date   CHOL 221 (H) 12/03/2019   HDL 52 12/03/2019   LDLCALC 143 (H) 12/03/2019   TRIG 140 12/03/2019   CHOLHDL 4.3 12/03/2019  We started pravastatin 20 mg daily in 01/2020.  - last eye exam was in 08/2020: Reportedly no DR- Dr. Dorcas Carrow.  - no numbness and tingling in her feet  Pt has  FH of DM in 2 uncles.  She has a history of HIV infection, for which she is seeing Dr. Linus Salmons.  Her mother was diagnosed with stage IV breast cancer last year. She moved in with the pt.  ROS: + see HPI  Past Medical History:  Diagnosis Date   Anemia    History of anemia    HIV positive ([place])    Hydradenitis 02/2013   right axilla   Non-insulin dependent type 2 diabetes mellitus ([place])    gestational only per patient   Past Surgical History:  Procedure Laterality Date   CHOLECYSTECTOMY  1999   HYDRADENITIS EXCISION Right 02/14/2013   Procedure: WIDE EXCISION HIDRADENITIS RIGHT AXILLA;  Surgeon: Harl Bowie, MD;  Location: [facility];   Service: General;  Laterality: Right;   TUBAL LIGATION  11/22/2005   WISDOM TOOTH EXTRACTION     Social History   Socioeconomic History   Marital status: Divorced    Spouse name: Not on file   Number of children: 3: 21, 63, 10 - 06/2019   Years of education: 16   Highest education level: Not on file  Occupational History   RN  hospice   Tobacco Use   Smoking status: Current Every Day Smoker    Packs/day: 0.50    Years: 7.00    Pack years: 3.50    Types: Cigarettes   Smokeless tobacco: Never Used  Substance and Sexual Activity   Alcohol use: No    Alcohol/week: 0.0 standard drinks   Drug use: No   Sexual activity: Yes    Partners: Male    Birth control/protection: Condom    Comment: pt. declined condoms  Other Topics Concern   Not on file  Social History Narrative   Not on file   Social Determinants of Health   Financial Resource Strain:    Difficulty of Paying Living Expenses:   Food Insecurity:    Worried About Charity fundraiser in the Last Year:    Arboriculturist in the Last Year:   Transportation Needs:    Film/video editor (Medical):    Lack of Transportation (Non-Medical):   Physical Activity:    Days of Exercise per Week:    Minutes of Exercise per Session:   Stress:    Feeling of Stress :   Social Connections:    Frequency of Communication with Friends and Family:    Frequency of Social Gatherings with Friends and Family:    Attends Religious Services:    Active Member of Clubs or Organizations:    Attends Music therapist:    Marital Status:   Intimate Partner Violence:    Fear of Current or Ex-Partner:    Emotionally Abused:    Physically Abused:    Sexually Abused:    Current Outpatient Medications on File Prior to Visit  Medication Sig Dispense Refill   Blood Glucose Monitoring Suppl (BLOOD GLUCOSE METER) kit Use as instructed 1 each 0   elvitegravir-cobicistat-emtricitabine-tenofovir (GENVOYA) 150-150-200-10 MG TABS  tablet Take 1 tablet by mouth daily. 30 tablet 4   gabapentin (NEURONTIN) 300 MG capsule TAKE 1 CAPSULE 3 TIMES A   DAY 270 capsule 3   glucose blood (ONETOUCH VERIO) test strip Use as instructed 3x a day 200 each 3   insulin degludec (TRESIBA FLEXTOUCH) 200 UNIT/ML FlexTouch Pen Inject 20 Units into the skin daily. 18 mL 3   Insulin Pen Needle 32G X 4 MM MISC Use  1x a day 100 each 3   OneTouch Delica Lancets 82L MISC Check sugars 3x a day 200 each 3   pravastatin (PRAVACHOL) 20 MG tablet Take 1 tablet (20 mg total) by mouth daily. 90 tablet 3   Semaglutide, 1 MG/DOSE, (OZEMPIC, 1 MG/DOSE,) 4 MG/3ML SOPN Inject 1 mg into the skin once a week. 9 mL 3   No current facility-administered medications on file prior to visit.   Allergies  Allergen Reactions   Shellfish Allergy Shortness Of Breath   Hydrocodone Nausea And Vomiting   Family History  Problem Relation Age of Onset   Cancer Maternal Grandfather    Diabetes Maternal Uncle    Cancer Maternal Grandmother     PE: BP (!) 158/90   Pulse 94   Ht $R'5\' 3"'Wg$  (1.6 m)   Wt 213 lb (96.6 kg)   SpO2 98%   BMI 37.73 kg/m  Wt Readings from Last 3 Encounters:  09/30/20 213 lb (96.6 kg)  06/03/20 209 lb 12.8 oz (95.2 kg)  01/28/20 210 lb 6.4 oz (95.4 kg)   Constitutional: overweight, in NAD Eyes: PERRLA, EOMI, no exophthalmos ENT: moist mucous membranes, no thyromegaly, no cervical lymphadenopathy Cardiovascular: tachycardia, RR, No MRG Respiratory: CTA B Gastrointestinal: abdomen soft, NT, ND, BS+ Musculoskeletal: no deformities, strength intact in all 4 Skin: moist, warm, no rashes Neurological: no tremor with outstretched hands, DTR normal in all 4  ASSESSMENT: 1. DM, insulin-dependent, uncontrolled, with complications - PN  We r/o type 1 diabetes: Component     Latest Ref Rng & Units 08/14/2019  C-Peptide     0.80 - 3.85 ng/mL 1.09  Glucose, Plasma     65 - 99 mg/dL 93  Islet Cell Ab     Neg:<1:1 Negative  ZNT8  Antibodies     <15 U/mL <10  Glutamic Acid Decarb Ab     <5 IU/mL <5   2. HL  3.  Obesity class I  PLAN:  1. Patient with longstanding, uncontrolled, type 2 diabetes (we ruled out type 1 diabetes), previously off all of her diabetic medications in 06/2019, with a very high HbA1c level.  At that time, she was overwhelmed by her diabetes.  We started long-acting insulin and also GLP-1 receptor agonist and her sugars improved.  We also discussed about improving diet and starting exercise.  At last visit, HbA1c was at goal, at 6.6%, stable, blood sugars were slightly higher than before.  We did not change her regimen at that time.  She did have some constipation and gas from Ozempic, but not very bothersome and she opted to continue the 0.5 mg weekly dose. -At today's visit, she tells me that she was out of insulin, and sugars were in the 200s at that time.  They started to improve after she restarted on insulin 1 month ago.  She had 1 lower blood sugar, in the 90s, and she is starting to decrease the dose of [place] and even missing doses afterwards, up to 12 a week.  We discussed not to skip [place] anymore.  Lowest blood sugar that I see in her meter downloads is 97.  I do see a significant trend towards improvement in blood sugars in the last month.  She also tolerates well the 1 mg Ozempic.  She still has constipation but this has not changed.  We will continue this dose and I advised her to continue the lower dose of Tresiba, of 12 units daily.  I strongly encouraged her to  try not to run out of her insulin anymore or to let me know if this is about to happen. - I suggested to:  Patient Instructions  Please continue: - Tresiba 12 units daily - Ozempic 1 mg weekly in a.m.  Please return in 4 months with your sugar log.   - we checked her HbA1c: 7.5% (higher) - advised to check sugars at different times of the day - 1x a day, rotating check times - advised for yearly eye exams >> she is UTD -  return to clinic in 4 months  2. HL -Reviewed latest lipid panel from 11/2019: LDL above target, the rest of the fractions at goal: Lab Results  Component Value Date   CHOL 221 (H) 12/03/2019   HDL 52 12/03/2019   LDLCALC 143 (H) 12/03/2019   TRIG 140 12/03/2019   CHOLHDL 4.3 12/03/2019  -We started pravastatin 20 mg daily and she tolerates this well -She will soon be due for another lipid panel  3.  Obesity class I -We will continue Ozempic which should also help with weight loss.  We could not increase the dose further at last visit due to some constipation and stomach pain.  We also discussed about using MiraLAX. -In the past, she was interested in intermittent fasting and we discussed about the best window for fasting.  She was also planning to start exercising more consistently -She lost 1 pound before last visit but gained 4 lbs since then  Philemon Kingdom, MD PhD [facility] Endocrin[facility]

## 2020-12-18 ENCOUNTER — Other Ambulatory Visit: Payer: Self-pay | Admitting: Internal Medicine

## 2021-01-15 ENCOUNTER — Other Ambulatory Visit: Payer: Self-pay

## 2021-01-15 MED ORDER — PRAVASTATIN SODIUM 20 MG PO TABS: 20.0000 mg | ORAL_TABLET | Freq: Every day | ORAL | 3 refills | Status: DC

## 2021-01-27 ENCOUNTER — Ambulatory Visit: Payer: Managed Care, Other (non HMO) | Admitting: Internal Medicine

## 2021-02-06 ENCOUNTER — Other Ambulatory Visit: Payer: Self-pay

## 2021-02-06 ENCOUNTER — Other Ambulatory Visit (HOSPITAL_COMMUNITY)
Admission: RE | Admit: 2021-02-06 | Discharge: 2021-02-06 | Disposition: A | Discharge status: Home / Self Care | Payer: Managed Care, Other (non HMO) | Source: Ambulatory Visit | Attending: Internal Medicine | Admitting: Internal Medicine

## 2021-02-06 ENCOUNTER — Other Ambulatory Visit: Payer: Managed Care, Other (non HMO)

## 2021-02-06 DIAGNOSIS — Z113 Encounter for screening for infections with a predominantly sexual mode of transmission: Secondary | ICD-10-CM | POA: Insufficient documentation

## 2021-02-06 DIAGNOSIS — Z79899 Other long term (current) drug therapy: Secondary | ICD-10-CM

## 2021-02-06 DIAGNOSIS — B20 Human immunodeficiency virus [HIV] disease: Secondary | ICD-10-CM

## 2021-02-09 LAB — LIPID PANEL
Cholesterol: 163 mg/dL (ref <200)
HDL: 54 mg/dL (ref >=50)
LDL Cholesterol (Calc): 88 mg/dL (calc)
Non-HDL Cholesterol (Calc): 109 mg/dL (calc) (ref <130)
Total CHOL/HDL Ratio: 3 (calc) (ref <5.0)
Triglycerides: 116 mg/dL (ref <150)

## 2021-02-09 LAB — COMPREHENSIVE METABOLIC PANEL
AG Ratio: 1.6 (calc) (ref 1.0–2.5)
ALT: 14 U/L (ref 6–29)
AST: 14 U/L (ref 10–30)
Albumin: 4.1 g/dL (ref 3.6–5.1)
Alkaline phosphatase (APISO): 65 U/L (ref 31–125)
BUN: 13 mg/dL (ref 7–25)
CO2: 25 mmol/L (ref 20–32)
Calcium: 9.3 mg/dL (ref 8.6–10.2)
Chloride: 101 mmol/L (ref 98–110)
Creat: 0.67 mg/dL (ref 0.50–0.99)
Globulin: 2.5 g/dL (calc) (ref 1.9–3.7)
Glucose, Bld: 137 mg/dL — ABNORMAL HIGH (ref 65–99)
Potassium: 4.1 mmol/L (ref 3.5–5.3)
Sodium: 134 mmol/L — ABNORMAL LOW (ref 135–146)
Total Bilirubin: 0.2 mg/dL (ref 0.2–1.2)
Total Protein: 6.6 g/dL (ref 6.1–8.1)

## 2021-02-09 LAB — CBC WITH DIFFERENTIAL/PLATELET
Absolute Monocytes: 640 cells/uL (ref 200–950)
Basophils Absolute: 62 cells/uL (ref 0–200)
Basophils Relative: 0.8 %
Eosinophils Absolute: 117 cells/uL (ref 15–500)
Eosinophils Relative: 1.5 %
HCT: 30.3 % — ABNORMAL LOW (ref 35.0–45.0)
Hemoglobin: 9.5 g/dL — ABNORMAL LOW (ref 11.7–15.5)
Lymphs Abs: 3237 cells/uL (ref 850–3900)
MCH: 24.5 pg — ABNORMAL LOW (ref 27.0–33.0)
MCHC: 31.4 g/dL — ABNORMAL LOW (ref 32.0–36.0)
MCV: 78.1 fL — ABNORMAL LOW (ref 80.0–100.0)
MPV: 9.6 fL (ref 7.5–12.5)
Monocytes Relative: 8.2 %
Neutro Abs: 3744 cells/uL (ref 1500–7800)
Neutrophils Relative %: 48 %
Platelets: 436 10*3/uL — ABNORMAL HIGH (ref 140–400)
RBC: 3.88 10*6/uL (ref 3.80–5.10)
RDW: 14.8 % (ref 11.0–15.0)
Total Lymphocyte: 41.5 %
WBC: 7.8 10*3/uL (ref 3.8–10.8)

## 2021-02-09 LAB — T-HELPER CELLS (CD4) COUNT (NOT AT ARMC)
Absolute CD4: 1003 cells/uL (ref 490–1740)
CD4 T Helper %: 30 % (ref 30–61)
Total lymphocyte count: 3375 cells/uL (ref 850–3900)

## 2021-02-09 LAB — URINE CYTOLOGY ANCILLARY ONLY
Chlamydia: NEGATIVE
Comment: NEGATIVE
Comment: NORMAL
Neisseria Gonorrhea: NEGATIVE

## 2021-02-09 LAB — RPR: RPR Ser Ql: NONREACTIVE

## 2021-02-09 LAB — HIV-1 RNA QUANT-NO REFLEX-BLD
HIV 1 RNA Quant: <20 Copies/mL — ABNORMAL HIGH
HIV-1 RNA Quant, Log: <1.3 Log cps/mL — ABNORMAL HIGH

## 2021-02-16 ENCOUNTER — Ambulatory Visit (INDEPENDENT_AMBULATORY_CARE_PROVIDER_SITE_OTHER): Payer: Managed Care, Other (non HMO)

## 2021-02-16 ENCOUNTER — Ambulatory Visit: Payer: Managed Care, Other (non HMO) | Admitting: Internal Medicine

## 2021-02-16 ENCOUNTER — Other Ambulatory Visit: Payer: Self-pay

## 2021-02-16 ENCOUNTER — Encounter: Payer: Self-pay | Admitting: Internal Medicine

## 2021-02-16 ENCOUNTER — Other Ambulatory Visit (HOSPITAL_COMMUNITY): Payer: Self-pay

## 2021-02-16 VITALS — BP 136/84 | HR 86 | Temp 98.4°F | Ht 63.0 in | Wt 209.0 lb

## 2021-02-16 DIAGNOSIS — D500 Iron deficiency anemia secondary to blood loss (chronic): Secondary | ICD-10-CM | POA: Diagnosis not present

## 2021-02-16 DIAGNOSIS — B20 Human immunodeficiency virus [HIV] disease: Secondary | ICD-10-CM | POA: Diagnosis not present

## 2021-02-16 DIAGNOSIS — Z113 Encounter for screening for infections with a predominantly sexual mode of transmission: Secondary | ICD-10-CM

## 2021-02-16 DIAGNOSIS — F4321 Adjustment disorder with depressed mood: Secondary | ICD-10-CM

## 2021-02-16 DIAGNOSIS — F17200 Nicotine dependence, unspecified, uncomplicated: Secondary | ICD-10-CM

## 2021-02-16 DIAGNOSIS — Z23 Encounter for immunization: Secondary | ICD-10-CM | POA: Diagnosis not present

## 2021-02-16 NOTE — Assessment & Plan Note (Signed)
Appropriate grief reaction and will schedule with our counselor.

## 2021-02-16 NOTE — Assessment & Plan Note (Signed)
I discussed the importance of cessation and she will continue her efforts at quiting.

## 2021-02-16 NOTE — Progress Notes (Signed)
   Subjective:    Patient ID: Melissa Cruz, female    DOB: 09/13/1980, 40 y.o.   MRN: [id number]  HPI He is here for follow up of HIV She is tearful today after losing her mother in August.  She continues on [place] with no missed doses.  CD4 1003 and viral load < 20. Some issues with getting the medication and she had to pay about 300$ one month when she got it too early.  She has noted she is eating more ice recently and her Hgb is down to 9.5 from 12.0 the year before.  She has a history of small fibroids.  She has increased menstrual pain and bleeding.  She has a gynecologist and will follow up with her.     Review of Systems  Constitutional:  Positive for fatigue. Negative for unexpected weight change.  Gastrointestinal:  Negative for diarrhea and nausea.  Skin:  Negative for rash.      Objective:   Physical Exam HENT:     Mouth/Throat:     Pharynx: No oropharyngeal exudate.  Eyes:     General: No scleral icterus. Pulmonary:     Effort: Pulmonary effort is normal.  Skin:    Findings: No rash.  Neurological:     General: No focal deficit present.     Mental Status: She is alert.  Psychiatric:        Mood and Affect: Mood normal.    SH: + tobacco and trying to quit      Assessment & Plan:

## 2021-02-16 NOTE — Assessment & Plan Note (Signed)
Screened negative 

## 2021-02-16 NOTE — Assessment & Plan Note (Addendum)
New issue.  I suspect this is menstrual related and will check a vaginal ultraound to see if fibroids are the issue.  She will then schedule with her gynecologist for follow up.

## 2021-02-16 NOTE — Assessment & Plan Note (Signed)
She is doing well on Genvoya though her biggest issue is getting the medication and some copay issues.  Will discuss with our pharmacy team to help.    40 minutes spent including 20 minutes with the patient on discussions of above.

## 2021-02-16 NOTE — Progress Notes (Signed)
   Covid-19 Vaccination Clinic  Name:  Melissa Cruz    MRN: [id number] DOB: 1980-08-18  02/16/2021  Melissa Cruz remained in the office for 10 minutes post Covid-19 immunization without incident. She was provided with Vaccine Information Sheet and instruction to access the V-Safe system. Patient politely declined full 15 minute observation period.   Melissa Cruz was instructed to call 911 with any severe reactions post vaccine: Difficulty breathing  Swelling of face and throat  A fast heartbeat  A bad rash all over body  Dizziness and weakness   Immunizations Administered     Name Date Dose VIS Date Route   Pfizer Covid-19 Vaccine Bivalent Booster 02/16/2021 11:03 AM 0.3 mL 12/03/2020 Intramuscular   Manufacturer: [employer], [employer]   Lot: [id number]   NDC: [address]-3      Sandie Ano, RN

## 2021-02-17 ENCOUNTER — Other Ambulatory Visit: Payer: Self-pay

## 2021-02-17 ENCOUNTER — Ambulatory Visit: Payer: Managed Care, Other (non HMO)

## 2021-02-18 ENCOUNTER — Ambulatory Visit
Admission: RE | Admit: 2021-02-18 | Discharge: 2021-02-18 | Disposition: A | Discharge status: Home / Self Care | Payer: Managed Care, Other (non HMO) | Source: Ambulatory Visit | Attending: Internal Medicine | Admitting: Internal Medicine

## 2021-02-18 ENCOUNTER — Other Ambulatory Visit: Payer: Self-pay | Admitting: Internal Medicine

## 2021-02-18 DIAGNOSIS — D500 Iron deficiency anemia secondary to blood loss (chronic): Secondary | ICD-10-CM

## 2021-02-18 DIAGNOSIS — B20 Human immunodeficiency virus [HIV] disease: Secondary | ICD-10-CM

## 2021-02-19 NOTE — Progress Notes (Unsigned)
Patient ID: Melissa Cruz, female   DOB: [birth year], 40 y.o.   MRN: [id number]  Therapist met with client as scheduled and continued rapport building. Therapist introduced and shared some information about herself and encouraged the same from client in order to build trust and openness within the counseling relationship. Therapist discussed / developed / reviewed treatment plan with client based on the result from client's assessment. Therapist discussed their current issues /diagnosis of DSM-5/as well as recommendations and target population eligibility services. Therapist assessed for SI/HI during session  Client met with therapist as scheduled. Client presented alert mood was euthymic; affect appeared to be congruent with client report of mood. Appearance was relaxed/casual, and attitude was appropriate and casual. Client was receptive to rapport building and goals implemented by therapist. Client shared some information about herself, such as: expressing likes, dislikes, and strengths. Client was able to make connections between consequences, challenges and alternative thoughts of mental health recovery. Client is currently going through a grieving process due to the loss of her mother 3 months ago. Client reports that she feels she is not getting over the loss quick enough and that he daily functioning is delayed. Client processed with clinician the stages of grief and talked about the large support she currently has in her life now. Client was able to end the session more lighthearted and grateful for the people she has in her life now. Client denied SI/HI.

## 2021-02-19 NOTE — Progress Notes (Unsigned)
Patient ID: Melissa Cruz, female   DOB: 20-Jul-1980, 40 y.o.   MRN: [id number]

## 2021-02-20 ENCOUNTER — Telehealth: Payer: Self-pay

## 2021-02-20 NOTE — Telephone Encounter (Signed)
-----   Message from Gardiner Barefoot, MD sent at 02/20/2021  7:20 AM EST ----- Regarding: FW: Please remind her to schedule w her gynecologist asap. Thanks ----- Message ----- From: Interface, Rad Results In Sent: 02/19/2021   3:23 PM EST To: Gardiner Barefoot, MD

## 2021-02-20 NOTE — Telephone Encounter (Signed)
Message sent to patient via My Chart.    Melissa Cruz [name], CMA

## 2021-03-03 ENCOUNTER — Telehealth: Payer: Self-pay

## 2021-03-03 ENCOUNTER — Other Ambulatory Visit (HOSPITAL_COMMUNITY): Payer: Self-pay

## 2021-03-03 DIAGNOSIS — B20 Human immunodeficiency virus [HIV] disease: Secondary | ICD-10-CM

## 2021-03-03 MED ORDER — GENVOYA 150-150-200-10 MG PO TABS
1.0000 | ORAL_TABLET | Freq: Every day | ORAL | 4 refills | Status: DC
  Filled 2021-03-03 – 2021-03-06 (×2): qty 30, 30d supply, fill #0
  Filled 2021-04-01: qty 30, 30d supply, fill #1
  Filled 2021-05-04: qty 30, 30d supply, fill #2
  Filled 2021-05-27: qty 30, 30d supply, fill #3
  Filled 2021-06-29: qty 30, 30d supply, fill #4

## 2021-03-03 NOTE — Telephone Encounter (Signed)
-----   Message from [place] sent at 03/03/2021 12:04 PM EST ----- Patient called in regards to her medication GENVOYA  she stated that Dr. Luciana Axe was planning on sending it over to a [facility] pharmacy because she has been having issues getting it how she usually does.. Patient said she only has a few days left, told her I would reach out and send a message!

## 2021-03-03 NOTE — Addendum Note (Signed)
Addended by: Linna Hoff D on: 03/03/2021 01:44 PM   Modules accepted: Orders

## 2021-03-03 NOTE — Telephone Encounter (Addendum)
RN canceled [facility] prescription with [facility] and resent to [facility] per patient request. Provided patient with phone number for [facility] and advised her to call and set up delivery.   Sandie Ano, RN

## 2021-03-03 NOTE — Telephone Encounter (Signed)
Spoke with patient, she is unsure if her insurance will allow her to use a different pharmacy. Routing to [facility] pharmacy team.    Sandie Ano, RN

## 2021-03-04 ENCOUNTER — Other Ambulatory Visit (HOSPITAL_COMMUNITY): Payer: Self-pay

## 2021-03-05 ENCOUNTER — Other Ambulatory Visit: Payer: Self-pay

## 2021-03-05 ENCOUNTER — Encounter: Payer: Self-pay | Admitting: Internal Medicine

## 2021-03-05 ENCOUNTER — Other Ambulatory Visit (HOSPITAL_COMMUNITY): Payer: Self-pay

## 2021-03-05 ENCOUNTER — Ambulatory Visit: Payer: Managed Care, Other (non HMO) | Admitting: Internal Medicine

## 2021-03-05 VITALS — BP 118/68 | HR 90 | Ht 63.0 in | Wt 206.6 lb

## 2021-03-05 DIAGNOSIS — E669 Obesity, unspecified: Secondary | ICD-10-CM | POA: Diagnosis not present

## 2021-03-05 DIAGNOSIS — E782 Mixed hyperlipidemia: Secondary | ICD-10-CM | POA: Diagnosis not present

## 2021-03-05 DIAGNOSIS — Z794 Long term (current) use of insulin: Secondary | ICD-10-CM

## 2021-03-05 DIAGNOSIS — E1140 Type 2 diabetes mellitus with diabetic neuropathy, unspecified: Secondary | ICD-10-CM

## 2021-03-05 DIAGNOSIS — B353 Tinea pedis: Secondary | ICD-10-CM

## 2021-03-05 LAB — POCT GLYCOSYLATED HEMOGLOBIN (HGB A1C): Hemoglobin A1C: 7.1 % — AB (ref 4.0–5.6)

## 2021-03-05 MED ORDER — FREESTYLE LIBRE 3 SENSOR MISC
1.0000 | 3 refills | Status: DC
  Filled 2021-03-05: qty 6, 84d supply, fill #0
  Filled 2021-04-21: qty 2, 28d supply, fill #0
  Filled 2021-07-31 (×2): qty 2, 28d supply, fill #1
  Filled 2021-12-15: qty 2, 28d supply, fill #2
  Filled 2022-01-20: qty 2, 28d supply, fill #3

## 2021-03-05 MED ORDER — NYSTATIN-TRIAMCINOLONE 100000-0.1 UNIT/GM-% EX OINT
1.0000 "application " | TOPICAL_OINTMENT | Freq: Two times a day (BID) | CUTANEOUS | 0 refills | Status: DC
  Filled 2021-03-05: qty 30, 15d supply, fill #0

## 2021-03-05 NOTE — Patient Instructions (Addendum)
Please continue: - Ozempic 1 mg weekly in a.m.  Start Mycolog ointment 2x a day for 2 weeks on your feet.  Please return in 3 months.

## 2021-03-05 NOTE — Progress Notes (Signed)
Patient ID: Melissa Cruz, female   DOB: 02/08/1981, 40 y.o.   MRN: [id number]   This visit occurred during the SARS-CoV-2 public health emergency.  Safety protocols were in place, including screening questions prior to the visit, additional usage of staff PPE, and extensive cleaning of exam room while observing appropriate contact time as indicated for disinfecting solutions.   HPI: Melissa Cruz is a 40 y.o.-year-old female, initially referred by Dr. Linus Salmons, returning for follow-up for DM, dx in 2014, GDM in ~2008 insulin-dependent since 2018, uncontrolled, with long-term complications (PN).  She did see endocrinology in the past (Dr. Chalmers Cater), but I do not have these records.  Last visit 5 months ago.  Interim history: No increased urination, blurry vision, nausea, chest pain.   Before last visit, she was again out of insulin after changing insurances and sugars increased with this.  She was also forgetting [place] doses approximately twice a week even after she was able to start it back. She had increased bleeding >> developed pica for ice. She had a recent ultrasound that showed multiple fibroids.  She was started on a medication for this. Her mother died in 21-Dec-2020 >> now in counseling. She stopped insulin then.  Also stopped checking blood sugars. She restarted exercise 3x a week. She had a Freestyle Libre CGM - was coming off. She describes pain and skin breakage between her toes.  She uses nystatin spray, but without consistent effects.  Reviewed HbA1c levels: Lab Results  Component Value Date   HGBA1C 7.5 (A) 09/30/2020   HGBA1C 6.6 (A) 06/03/2020   HGBA1C 6.6 (A) 01/28/2020   HGBA1C 6.4 (A) 10/16/2019   HGBA1C 12.1 (A) 06/28/2019   HGBA1C 8.4 (H) 11/09/2016   HGBA1C 9.0 (H) 08/17/2012   HGBA1C 7.8 (H) 04/27/2012  She was tested for insulin deficiency >> she did have a low C-peptide (no records)  Previously on: She was on NovoLog 70/30 40 units 2x a day. She  tried Metformin IR and ER >> significant diarrhea.   We changed her regimen to: - Tresiba 20 >> 28 >> 20 >> 12 units daily >> off insulin since 2020/12/21 - Ozempic 0.5 >> 1 mg weekly in a.m.  She is not checking sugars now, previously: - am: 76, 89, 97-126, 148, 153 >> 95-112 >> 90-120 >> 124, 176 - 2h after b'fast: n/c - before lunch: n/c >> 97, 120 - 2h after lunch: n/c - before dinner: n/c >> 95-112 >> 92-131 >> 113-131 - 2h after dinner: n/c >> 120 >> n/c - bedtime: n/c >> 108, 111, 158 >> n/c >> 102-132, 241 - nighttime: n/c Lowest sugar was 76 >> 95 >> 90 >> 102; she has hypoglycemia awareness in the 80s. Highest sugar was 380 ... >> 128 >> 145 >> 241.  Glucometer: One Touch Verio  Pt's meals are: - Breakfast: cereals, eggs, sausage, toast, pancakes (dinner for her) - Lunch: soup, sub, chicken, salad - Dinner:chicken, fish, fries - Snacks: 3-4 She tried Keto diet for 3 mo >> lost weight >> could come off insulin. She works night shift: 8 pm-8 am. Before the pandemic, she was exercising (spin classes) but then stopped.   No CKD, last BUN/creatinine:  Lab Results  Component Value Date   BUN 13 02/06/2021   BUN 8 12/03/2019   CREATININE 0.67 02/06/2021   CREATININE 0.60 12/03/2019  Not on ACE inhibitor/ARB  + HL; last set of lipids: Lab Results  Component Value Date   CHOL 163 02/06/2021  HDL 54 02/06/2021   LDLCALC 88 02/06/2021   TRIG 116 02/06/2021   CHOLHDL 3.0 02/06/2021  We started pravastatin 20 mg daily in 01/2020.  - last eye exam was in 08/2020: Reportedly no DR- Dr. Dorcas Carrow.  - no numbness and tingling in her feet  Pt has FH of DM in 2 uncles.  She has a history of HIV infection, for which she is seeing Dr. Linus Salmons.  Her mother was diagnosed with stage IV breast cancer last year. She moved in with the pt.  ROS: + see HPI  Past Medical History:  Diagnosis Date   Anemia    History of anemia    HIV positive ([place])    Hydradenitis 02/2013    right axilla   Non-insulin dependent type 2 diabetes mellitus ([place])    gestational only per patient   Past Surgical History:  Procedure Laterality Date   CHOLECYSTECTOMY  1999   HYDRADENITIS EXCISION Right 02/14/2013   Procedure: WIDE EXCISION HIDRADENITIS RIGHT AXILLA;  Surgeon: Harl Bowie, MD;  Location: [place];  Service: General;  Laterality: Right;   TUBAL LIGATION  11/22/2005   WISDOM TOOTH EXTRACTION     Social History   Socioeconomic History   Marital status: Divorced    Spouse name: Not on file   Number of children: 3: 21, 25, 86 - 06/2019   Years of education: 16   Highest education level: Not on file  Occupational History   RN  hospice   Tobacco Use   Smoking status: Current Every Day Smoker    Packs/day: 0.50    Years: 7.00    Pack years: 3.50    Types: Cigarettes   Smokeless tobacco: Never Used  Substance and Sexual Activity   Alcohol use: No    Alcohol/week: 0.0 standard drinks   Drug use: No   Sexual activity: Yes    Partners: Male    Birth control/protection: Condom    Comment: pt. declined condoms  Other Topics Concern   Not on file  Social History Narrative   Not on file   Social Determinants of Health   Financial Resource Strain:    Difficulty of Paying Living Expenses:   Food Insecurity:    Worried About Charity fundraiser in the Last Year:    Arboriculturist in the Last Year:   Transportation Needs:    Film/video editor (Medical):    Lack of Transportation (Non-Medical):   Physical Activity:    Days of Exercise per Week:    Minutes of Exercise per Session:   Stress:    Feeling of Stress :   Social Connections:    Frequency of Communication with Friends and Family:    Frequency of Social Gatherings with Friends and Family:    Attends Religious Services:    Active Member of Clubs or Organizations:    Attends Music therapist:    Marital Status:   Intimate Partner Violence:    Fear of  Current or Ex-Partner:    Emotionally Abused:    Physically Abused:    Sexually Abused:    Current Outpatient Medications on File Prior to Visit  Medication Sig Dispense Refill   aspirin EC 81 MG tablet Take 81 mg by mouth daily. Swallow whole.     Blood Glucose Monitoring Suppl (BLOOD GLUCOSE METER) kit Use as instructed (Patient not taking: Reported on 02/16/2021) 1 each 0   Continuous Blood Gluc Sensor (FREESTYLE LIBRE 2  SENSOR) MISC 1 each by Does not apply route every 14 (fourteen) days. (Patient not taking: Reported on 02/16/2021) 6 each 3   elvitegravir-cobicistat-emtricitabine-tenofovir (GENVOYA) 150-150-200-10 MG TABS tablet Take 1 tablet by mouth daily. 30 tablet 4   gabapentin (NEURONTIN) 300 MG capsule TAKE 1 CAPSULE 3 TIMES A   DAY 270 capsule 3   glucose blood (ONETOUCH VERIO) test strip Use as instructed 3x a day (Patient not taking: Reported on 02/16/2021) 200 each 3   insulin degludec (TRESIBA FLEXTOUCH) 200 UNIT/ML FlexTouch Pen Inject 20 Units into the skin daily. 18 mL 3   Insulin Pen Needle 32G X 4 MM MISC Use 1x a day 100 each 3   omeprazole (PRILOSEC) 20 MG capsule Take 20 mg by mouth daily.     OneTouch Delica Lancets 82U MISC Check sugars 3x a day 200 each 3   pravastatin (PRAVACHOL) 20 MG tablet Take 1 tablet (20 mg total) by mouth daily. 90 tablet 3   Semaglutide, 1 MG/DOSE, (OZEMPIC, 1 MG/DOSE,) 4 MG/3ML SOPN Inject 1 mg into the skin once a week. 9 mL 3   No current facility-administered medications on file prior to visit.   Allergies  Allergen Reactions   Shellfish Allergy Shortness Of Breath   Hydrocodone Nausea And Vomiting   Family History  Problem Relation Age of Onset   Cancer Maternal Grandfather    Diabetes Maternal Uncle    Cancer Maternal Grandmother     PE: BP 118/68 (BP Location: Left Arm, Patient Position: Sitting, Cuff Size: Normal)   Pulse 90   Ht 5' 3" (1.6 m)   Wt 206 lb 9.6 oz (93.7 kg)   SpO2 99%   BMI 36.60 kg/m  Wt Readings  from Last 3 Encounters:  03/05/21 206 lb 9.6 oz (93.7 kg)  02/16/21 209 lb (94.8 kg)  09/30/20 213 lb (96.6 kg)   Constitutional: overweight, in NAD Eyes: PERRLA, EOMI, no exophthalmos ENT: moist mucous membranes, no thyromegaly, no cervical lymphadenopathy Cardiovascular: tachycardia, RR, No MRG Respiratory: CTA B Musculoskeletal: no deformities, strength intact in all 4 Skin: moist, warm, + macerated skin under and between toes on both feet Neurological: no tremor with outstretched hands, DTR normal in all 4  ASSESSMENT: 1. DM, insulin-dependent, uncontrolled, with complications - PN  We r/o type 1 diabetes: Component     Latest Ref Rng & Units 08/14/2019  C-Peptide     0.80 - 3.85 ng/mL 1.09  Glucose, Plasma     65 - 99 mg/dL 93  Islet Cell Ab     Neg:<1:1 Negative  ZNT8 Antibodies     <15 U/mL <10  Glutamic Acid Decarb Ab     <5 IU/mL <5   2. HL  3.  Obesity class I  4.  Tinea pedis  PLAN:  1. Patient with longstanding type 2 diabetes, with history of noncompliance with medications in the past due to problems with insurance and also being overwhelmed by her diabetes.  She was improved after starting long-acting insulin and weekly GLP-1 receptor agonist but at last visit, she was without insulin and even when she started in she was forgetting doses.  We discussed that she needs to take it every day and not skip doses but we did not change the regimen at that time.  I strongly encouraged her not to run out of insulin anymore.  HbA1c was higher, at 7.5%. -At this visit, we discussed about how to apply the freestyle libre CGM correctly.  She did  have one that was coming off.  I sent a prescription for the freestyle libre 3 to the pharmacy and discussed about differences from the freestyle libre 2. -She is not checking sugars at home.  I advised her that this is dangerous, especially since she has not taken any insulin in the last 3 months.  However, as of now, based on the  HbA1c (see below), sugars are not out of control so we can continue without insulin.  I advised her to continue Ozempic, with which she is now consistent.  She has no side effects from it. - I suggested to:  Patient Instructions  Please continue: - Ozempic 1 mg weekly in a.m.  Start Mycolog ointment 2x a day for 2 weeks on your feet.  Please return in 3 months.  - we checked her HbA1c: 7.1% (better) - advised to check sugars at different times of the day - 4x a day, rotating check times - advised for yearly eye exams >> she is UTD - return to clinic in 3 months  2. HL -Reviewed latest lipid panel: Fractions at goal: Lab Results  Component Value Date   CHOL 163 02/06/2021   HDL 54 02/06/2021   LDLCALC 88 02/06/2021   TRIG 116 02/06/2021   CHOLHDL 3.0 02/06/2021  -On pravastatin 20 mg daily with good tolerance  3.  Obesity class I -We will continue Ozempic which should also help with weight loss.  We could not increase the dose due to constipation.  I advised her to use MiraLAX. -In the past, she was interested in intermittent fasting and we discussed about the best window for fasting.   -She gained 4 pounds before last visit, now lost 7 since then  4.  Tinea pedis -She has macerated skin on the toes and between her toes on both feet. -She tried a spray antifungal without consistent relief -I advised her to try an antifungal + steroid ointment apply twice a day for 2 weeks.  She does not have a PCP.  I recommended to establish care with one.  Philemon Kingdom, MD PhD [facility]

## 2021-03-06 ENCOUNTER — Encounter: Payer: Self-pay | Admitting: Internal Medicine

## 2021-03-06 ENCOUNTER — Other Ambulatory Visit (HOSPITAL_COMMUNITY): Payer: Self-pay

## 2021-03-06 ENCOUNTER — Telehealth: Payer: Self-pay

## 2021-03-06 DIAGNOSIS — E1140 Type 2 diabetes mellitus with diabetic neuropathy, unspecified: Secondary | ICD-10-CM

## 2021-03-06 NOTE — Telephone Encounter (Signed)
[facility] Patient Advocate Encounter   Was successful in obtaining a [employer] copay card for [place].  This copay card will make the patients copay $0.00.  I have spoken with the patient.    The billing information is as follows and has been shared with [facility].        Dryden Tapley, CPhT [facility] Patient Advocate [facility] Phone: [phone] Fax:  [phone]  

## 2021-03-13 ENCOUNTER — Other Ambulatory Visit (HOSPITAL_COMMUNITY): Payer: Self-pay

## 2021-03-17 ENCOUNTER — Other Ambulatory Visit (HOSPITAL_COMMUNITY): Payer: Self-pay

## 2021-03-17 MED ORDER — OZEMPIC (1 MG/DOSE) 4 MG/3ML ~~LOC~~ SOPN
1.0000 mg | PEN_INJECTOR | SUBCUTANEOUS | 1 refills | Status: DC
  Filled 2021-03-17 (×2): qty 3, 28d supply, fill #0
  Filled 2021-04-21: qty 3, 28d supply, fill #1
  Filled 2021-05-04: qty 9, 84d supply, fill #2

## 2021-04-01 ENCOUNTER — Other Ambulatory Visit (HOSPITAL_COMMUNITY): Payer: Self-pay

## 2021-04-02 ENCOUNTER — Other Ambulatory Visit (HOSPITAL_COMMUNITY): Payer: Self-pay

## 2021-04-21 ENCOUNTER — Other Ambulatory Visit (HOSPITAL_COMMUNITY): Payer: Self-pay

## 2021-05-04 ENCOUNTER — Other Ambulatory Visit (HOSPITAL_COMMUNITY): Payer: Self-pay

## 2021-05-05 ENCOUNTER — Other Ambulatory Visit (HOSPITAL_COMMUNITY): Payer: Self-pay

## 2021-05-13 ENCOUNTER — Other Ambulatory Visit (HOSPITAL_COMMUNITY): Payer: Self-pay

## 2021-05-19 ENCOUNTER — Encounter: Payer: Self-pay | Admitting: Internal Medicine

## 2021-05-19 ENCOUNTER — Other Ambulatory Visit: Payer: Self-pay | Admitting: Internal Medicine

## 2021-05-19 ENCOUNTER — Other Ambulatory Visit (HOSPITAL_COMMUNITY): Payer: Self-pay

## 2021-05-19 DIAGNOSIS — G63 Polyneuropathy in diseases classified elsewhere: Secondary | ICD-10-CM

## 2021-05-19 DIAGNOSIS — E782 Mixed hyperlipidemia: Secondary | ICD-10-CM

## 2021-05-19 MED ORDER — PRAVASTATIN SODIUM 20 MG PO TABS
20.0000 mg | ORAL_TABLET | Freq: Every day | ORAL | 1 refills | Status: DC
  Filled 2021-05-19 – 2021-12-15 (×2): qty 90, 90d supply, fill #0

## 2021-05-19 MED ORDER — GABAPENTIN 300 MG PO CAPS
ORAL_CAPSULE | ORAL | 11 refills | Status: DC
  Filled 2021-05-19 – 2021-07-23 (×5): qty 270, 90d supply, fill #0
  Filled 2021-10-20: qty 270, 90d supply, fill #1
  Filled 2021-12-15 – 2022-01-20 (×2): qty 270, 90d supply, fill #2
  Filled 2022-04-26: qty 270, 90d supply, fill #3

## 2021-05-21 ENCOUNTER — Other Ambulatory Visit (HOSPITAL_COMMUNITY): Payer: Self-pay

## 2021-05-26 ENCOUNTER — Other Ambulatory Visit (HOSPITAL_COMMUNITY): Payer: Self-pay

## 2021-05-27 ENCOUNTER — Other Ambulatory Visit (HOSPITAL_COMMUNITY): Payer: Self-pay

## 2021-06-01 ENCOUNTER — Other Ambulatory Visit (HOSPITAL_COMMUNITY): Payer: Self-pay

## 2021-06-15 ENCOUNTER — Ambulatory Visit: Payer: Managed Care, Other (non HMO) | Admitting: Internal Medicine

## 2021-06-15 ENCOUNTER — Encounter: Payer: Self-pay | Admitting: Internal Medicine

## 2021-06-15 ENCOUNTER — Other Ambulatory Visit (HOSPITAL_COMMUNITY): Payer: Self-pay

## 2021-06-15 ENCOUNTER — Other Ambulatory Visit: Payer: Self-pay

## 2021-06-15 VITALS — BP 110/68 | HR 77 | Ht 63.0 in | Wt 214.8 lb

## 2021-06-15 DIAGNOSIS — E1140 Type 2 diabetes mellitus with diabetic neuropathy, unspecified: Secondary | ICD-10-CM

## 2021-06-15 DIAGNOSIS — Z794 Long term (current) use of insulin: Secondary | ICD-10-CM

## 2021-06-15 DIAGNOSIS — E782 Mixed hyperlipidemia: Secondary | ICD-10-CM | POA: Diagnosis not present

## 2021-06-15 DIAGNOSIS — E669 Obesity, unspecified: Secondary | ICD-10-CM | POA: Diagnosis not present

## 2021-06-15 LAB — POCT GLYCOSYLATED HEMOGLOBIN (HGB A1C): Hemoglobin A1C: 6.6 % — AB (ref 4.0–5.6)

## 2021-06-15 MED ORDER — NYSTATIN-TRIAMCINOLONE 100000-0.1 UNIT/GM-% EX OINT
1.0000 "application " | TOPICAL_OINTMENT | Freq: Two times a day (BID) | CUTANEOUS | 0 refills | Status: DC
  Filled 2021-06-15: qty 30, 15d supply, fill #0

## 2021-06-15 NOTE — Patient Instructions (Addendum)
Please continue: ?- Ozempic 1 mg weekly in a.m. ? ?Use again Mycolog ointment 2x a day for 2 weeks on your feet. ? ?Please return in 4 months. ?

## 2021-06-15 NOTE — Progress Notes (Signed)
Patient ID: Melissa Cruz, female   DOB: 24-May-1980, 41 y.o.   MRN: [id number]   This visit occurred during the SARS-CoV-2 public health emergency.  Safety protocols were in place, including screening questions prior to the visit, additional usage of staff PPE, and extensive cleaning of exam room while observing appropriate contact time as indicated for disinfecting solutions.   HPI: Melissa Cruz is a 41 y.o.-year-old female, initially referred by Dr. Linus Salmons, returning for follow-up for DM, dx in 2014, GDM in ~2008 insulin-dependent since 2018, uncontrolled, with long-term complications (PN).  She saw endocrinology in the past (Dr. Chalmers Cater), but I do not have these records.  Last visit 3.5 months ago.  Interim history: No increased urination, blurry vision, nausea, chest pain.   She developed anemia in the past with pica for ice due to multiple fibroids. She was started on Tranexamic acid with her cycles. She continues exercise 3x a week - including Zumba. She quit smoking 03/2021 due to extensive family history of lung cancer including her mother. She feels that the Mycolog cream that I prescribed last visit worked very well, but she has a mild recurrence of her tinea pedis-developed recently. She just returned from vacation at the beach.  Reviewed HbA1c levels: Lab Results  Component Value Date   HGBA1C 7.1 (A) 03/05/2021   HGBA1C 7.5 (A) 09/30/2020   HGBA1C 6.6 (A) 06/03/2020   HGBA1C 6.6 (A) 01/28/2020   HGBA1C 6.4 (A) 10/16/2019   HGBA1C 12.1 (A) 06/28/2019   HGBA1C 8.4 (H) 11/09/2016   HGBA1C 9.0 (H) 08/17/2012   HGBA1C 7.8 (H) 04/27/2012  She was tested for insulin deficiency >> she did have a low C-peptide (no records)  Previously on: She was on NovoLog 70/30 40 units 2x a day. She tried Metformin IR and ER >> significant diarrhea.   We changed her regimen to: - >> off insulin since 11/2020 - Ozempic 0.5 >> 1 mg weekly in a.m.  She is checking >4x a day with  her CGM:  Prev.: - am: 76, 89, 97-126, 148, 153 >> 95-112 >> 90-120 >> 124, 176 - 2h after b'fast: n/c - before lunch: n/c >> 97, 120 - 2h after lunch: n/c - before dinner: n/c >> 95-112 >> 92-131 >> 113-131 - 2h after dinner: n/c >> 120 >> n/c - bedtime: n/c >> 108, 111, 158 >> n/c >> 102-132, 241 - nighttime: n/c Lowest sugar was 76 >> 95 >> 90 >> 102 >> 87; she has hypoglycemia awareness in the 80s. Highest sugar was 380 ... >> 128 >> 145 >> 241 >> 200s.  Glucometer: One Touch Verio  Pt's meals are: - Breakfast: cereals, eggs, sausage, toast, pancakes (dinner for her) - Lunch: soup, sub, chicken, salad - Dinner:chicken, fish, fries - Snacks: 3-4 She tried Keto diet for 3 mo >> lost weight >> could come off insulin. She works night shift: 8 pm-8 am. Before the pandemic, she was exercising (spin classes) but then stopped.   No CKD, last BUN/creatinine:  Lab Results  Component Value Date   BUN 13 02/06/2021   BUN 8 12/03/2019   CREATININE 0.67 02/06/2021   CREATININE 0.60 12/03/2019  Not on ACE inhibitor/ARB  + HL; last set of lipids: Lab Results  Component Value Date   CHOL 163 02/06/2021   HDL 54 02/06/2021   LDLCALC 88 02/06/2021   TRIG 116 02/06/2021   CHOLHDL 3.0 02/06/2021  We started pravastatin 20 mg daily in 01/2020.  - last eye exam  was in 08/2020: Reportedly no DR- Dr. Dorcas Carrow.  - no numbness and tingling in her feet.   Pt has FH of DM in 2 uncles.  She has a history of HIV infection, for which she is seeing Dr. Linus Salmons.  ROS: + see HPI  Past Medical History:  Diagnosis Date   Anemia    History of anemia    HIV positive ([place])    Hydradenitis 02/2013   right axilla   Non-insulin dependent type 2 diabetes mellitus ([place])    gestational only per patient   Past Surgical History:  Procedure Laterality Date   CHOLECYSTECTOMY  1999   HYDRADENITIS EXCISION Right 02/14/2013   Procedure: WIDE EXCISION HIDRADENITIS RIGHT AXILLA;  Surgeon: Harl Bowie, MD;  Location: [place];  Service: General;  Laterality: Right;   TUBAL LIGATION  11/22/2005   WISDOM TOOTH EXTRACTION     Social History   Socioeconomic History   Marital status: Divorced    Spouse name: Not on file   Number of children: 3: 21, 27, 28 - 06/2019   Years of education: 16   Highest education level: Not on file  Occupational History   RN  hospice   Tobacco Use   Smoking status: Current Every Day Smoker    Packs/day: 0.50    Years: 7.00    Pack years: 3.50    Types: Cigarettes   Smokeless tobacco: Never Used  Substance and Sexual Activity   Alcohol use: No    Alcohol/week: 0.0 standard drinks   Drug use: No   Sexual activity: Yes    Partners: Male    Birth control/protection: Condom    Comment: pt. declined condoms  Other Topics Concern   Not on file  Social History Narrative   Not on file   Social Determinants of Health   Financial Resource Strain:    Difficulty of Paying Living Expenses:   Food Insecurity:    Worried About Charity fundraiser in the Last Year:    Arboriculturist in the Last Year:   Transportation Needs:    Film/video editor (Medical):    Lack of Transportation (Non-Medical):   Physical Activity:    Days of Exercise per Week:    Minutes of Exercise per Session:   Stress:    Feeling of Stress :   Social Connections:    Frequency of Communication with Friends and Family:    Frequency of Social Gatherings with Friends and Family:    Attends Religious Services:    Active Member of Clubs or Organizations:    Attends Music therapist:    Marital Status:   Intimate Partner Violence:    Fear of Current or Ex-Partner:    Emotionally Abused:    Physically Abused:    Sexually Abused:    Current Outpatient Medications on File Prior to Visit  Medication Sig Dispense Refill   aspirin EC 81 MG tablet Take 81 mg by mouth daily. Swallow whole.     Blood Glucose Monitoring Suppl (BLOOD GLUCOSE  METER) kit Use as instructed 1 each 0   Continuous Blood Gluc Sensor (FREESTYLE LIBRE 3 SENSOR) MISC Apply sensor every 14 days as directed. 6 each 3   elvitegravir-cobicistat-emtricitabine-tenofovir (GENVOYA) 150-150-200-10 MG TABS tablet Take 1 tablet by mouth daily. 30 tablet 4   gabapentin (NEURONTIN) 300 MG capsule TAKE 1 CAPSULE BY MOUTH 3 TIMES A DAY 270 capsule 11   glucose blood (ONETOUCH VERIO) test  strip Use as instructed 3x a day 200 each 3   Insulin Pen Needle 32G X 4 MM MISC Use 1x a day 100 each 3   nystatin-triamcinolone ointment (MYCOLOG) Apply topically 2 (two) times daily. 30 g 0   omeprazole (PRILOSEC) 20 MG capsule Take 20 mg by mouth daily.     OneTouch Delica Lancets 29F MISC Check sugars 3x a day 200 each 3   pravastatin (PRAVACHOL) 20 MG tablet Take 1 tablet (20 mg total) by mouth daily. 90 tablet 1   Semaglutide, 1 MG/DOSE, (OZEMPIC, 1 MG/DOSE,) 4 MG/3ML SOPN Inject 1 mg into the skin once a week. 9 mL 1   No current facility-administered medications on file prior to visit.   Allergies  Allergen Reactions   Shellfish Allergy Shortness Of Breath   Hydrocodone Nausea And Vomiting   Family History  Problem Relation Age of Onset   Cancer Maternal Grandfather    Diabetes Maternal Uncle    Cancer Maternal Grandmother     PE: BP 110/68 (BP Location: Right Arm, Patient Position: Sitting, Cuff Size: Normal)    Pulse 77    Ht $R'5\' 3"'jw$  (1.6 m)    Wt 214 lb 12.8 oz (97.4 kg)    SpO2 99%    BMI 38.05 kg/m  Wt Readings from Last 3 Encounters:  06/15/21 214 lb 12.8 oz (97.4 kg)  03/05/21 206 lb 9.6 oz (93.7 kg)  02/16/21 209 lb (94.8 kg)   Constitutional: overweight, in NAD Eyes: PERRLA, EOMI, no exophthalmos ENT: moist mucous membranes, no thyromegaly, no cervical lymphadenopathy Cardiovascular: tachycardia, RR, No MRG Respiratory: CTA B Skin: moist, warm Neurological: no tremor with outstretched hands, DTR normal in all 4 Diabetic Foot Exam - Simple   Simple Foot  Form Diabetic Foot exam was performed with the following findings: Yes 06/15/2021 10:25 AM  Visual Inspection See comments: Yes Sensation Testing Intact to touch and monofilament testing bilaterally: Yes Pulse Check Posterior Tibialis and Dorsalis pulse intact bilaterally: Yes Comments Mild skin maceration between 4th and 5th toes bilaterally    ASSESSMENT: 1. DM, insulin-dependent, uncontrolled, with complications - PN  We r/o type 1 diabetes: Component     Latest Ref Rng & Units 08/14/2019  C-Peptide     0.80 - 3.85 ng/mL 1.09  Glucose, Plasma     65 - 99 mg/dL 93  Islet Cell Ab     Neg:<1:1 Negative  ZNT8 Antibodies     <15 U/mL <10  Glutamic Acid Decarb Ab     <5 IU/mL <5   2. HL  3.  Obesity class I  4.  Tinea pedis  PLAN:  1. Patient with longstanding, uncontrolled, type 2 diabetes, with history of noncompliance with medications in the past due to problems with insurance also being overwhelmed by her diabetes.  Her diabetes control improved after starting a weekly GLP-1 receptor agonist and also long-acting insulin but she was without insulin in the past and forgetting doses.  At last visit, she was off the insulin completely and was not checking sugars.  I advised her that this is dangerous practice.  An HbA1c, however, was not very high, at 7.1%, in fact improved from before so we did not restart insulin.  We continued Ozempic and I advised her to start the freestyle libre CGM.  She did have a libre CGM in the past with coming off.  She was able to restart it since last visit. CGM interpretation: -At today's visit, we reviewed her CGM  downloads to February 28 (she has been at the beach and did not attach the sensor since then): It appears that 89% of values are in target range (goal >70%), while 11% are higher than 180 (goal <25%), and 0% are lower than 70 (goal <4%).  The calculated average blood sugar is 149.  The projected HbA1c for the next 3 months (GMI) is  6.9%. -Reviewing the CGM trends, it appears that her sugars are fluctuating mostly in the normal range, with only an occasional slightly hyperglycemic peak after meals, but the majority of the blood sugars are still lower than 180.  I congratulated her for the blood sugar control and will not change her regimen for now - I suggested to:  Patient Instructions  Please continue: - Ozempic 1 mg weekly in a.m.  Start Mycolog ointment 2x a day for 2 weeks on your feet.  Please return in 3 months.  - we checked her HbA1c: 7.1% (better) - advised to check sugars at different times of the day - 4x a day, rotating check times - advised for yearly eye exams >> she is UTD -Foot exam performed today - return to clinic in 3 months  2. HL -Reviewed latest lipid panel: Fractions at goal with the exception of a slightly high LDL (target lower than 70) Lab Results  Component Value Date   CHOL 163 02/06/2021   HDL 54 02/06/2021   LDLCALC 88 02/06/2021   TRIG 116 02/06/2021   CHOLHDL 3.0 02/06/2021  -On pravastatin 20 mg daily with good tolerance  3.  Obesity class I -We will continue Ozempic which should also help with weight loss.  We could not increase the dose due to constipation.  I recommended MiraLAX. -At last visit, we also discussed about the best fasting window as she was thinking about starting intermittent fasting -She lost 7 pounds before last visit but gained 8 lbs since then, most likely as she recently returned from vacation  4.  Tinea pedis -She tried an antifungal spray without consistent relief -At last visit I recommended to try an antifungal + steroid ointment applied twice a day for 2 weeks.  She used this and it worked very well.  However, she now has a mild recurrence.  On the foot exam, she has macerated skin between the fourth and fifth toes.  I recommended a new course of Mycolog.  Refilled this for her.  She does not have a PCP.  I recommended to establish care with  one.  Philemon Kingdom, MD PhD [facility]

## 2021-06-16 ENCOUNTER — Other Ambulatory Visit (HOSPITAL_COMMUNITY): Payer: Self-pay

## 2021-06-25 ENCOUNTER — Other Ambulatory Visit (HOSPITAL_COMMUNITY): Payer: Self-pay

## 2021-06-29 ENCOUNTER — Other Ambulatory Visit (HOSPITAL_COMMUNITY): Payer: Self-pay

## 2021-07-22 ENCOUNTER — Other Ambulatory Visit (HOSPITAL_COMMUNITY): Payer: Self-pay

## 2021-07-22 ENCOUNTER — Telehealth: Payer: Self-pay | Admitting: Pharmacy Technician

## 2021-07-22 NOTE — Telephone Encounter (Signed)
Patient Advocate Encounter ? ?Received notification from [employer] that prior authorization for [facility] 4MG /3ML is required. ?  ?PA submitted on 4.19.23 ?Key   ?Status is pending ?  ?[facility] will continue to follow ? ?Brighten Orndoff R Nardos Putnam, CPhT ?Patient Advocate ?[facility] ?Phone: [phone] ?Fax:  [phone] ? ?

## 2021-07-23 ENCOUNTER — Other Ambulatory Visit: Payer: Self-pay | Admitting: Internal Medicine

## 2021-07-23 ENCOUNTER — Other Ambulatory Visit (HOSPITAL_COMMUNITY): Payer: Self-pay

## 2021-07-23 DIAGNOSIS — E1140 Type 2 diabetes mellitus with diabetic neuropathy, unspecified: Secondary | ICD-10-CM

## 2021-07-23 DIAGNOSIS — B20 Human immunodeficiency virus [HIV] disease: Secondary | ICD-10-CM

## 2021-07-23 MED ORDER — GENVOYA 150-150-200-10 MG PO TABS
1.0000 | ORAL_TABLET | Freq: Every day | ORAL | 1 refills | Status: DC
  Filled 2021-07-31: qty 30, 30d supply, fill #0

## 2021-07-23 NOTE — Telephone Encounter (Signed)
Received notification from [facility] regarding a prior authorization for OZEMPIC1 MG . Authorization has been APPROVED from 07/22/21 to 07/21/22.  ? ? ? ? ? ?

## 2021-07-24 ENCOUNTER — Other Ambulatory Visit (HOSPITAL_COMMUNITY): Payer: Self-pay

## 2021-07-24 MED ORDER — OZEMPIC (1 MG/DOSE) 4 MG/3ML ~~LOC~~ SOPN
1.0000 mg | PEN_INJECTOR | SUBCUTANEOUS | 1 refills | Status: DC
  Filled 2021-07-24: qty 9, 84d supply, fill #0
  Filled 2021-10-20: qty 9, 84d supply, fill #1

## 2021-07-27 ENCOUNTER — Other Ambulatory Visit (HOSPITAL_COMMUNITY): Payer: Self-pay

## 2021-07-31 ENCOUNTER — Other Ambulatory Visit (HOSPITAL_COMMUNITY): Payer: Self-pay

## 2021-08-11 ENCOUNTER — Other Ambulatory Visit: Payer: BC Managed Care – PPO

## 2021-08-11 ENCOUNTER — Other Ambulatory Visit: Payer: Self-pay

## 2021-08-11 DIAGNOSIS — B20 Human immunodeficiency virus [HIV] disease: Secondary | ICD-10-CM

## 2021-08-12 LAB — T-HELPER CELL (CD4) - (RCID CLINIC ONLY)
CD4 % Helper T Cell: 28 % — ABNORMAL LOW (ref 33–65)
CD4 T Cell Abs: 727 /uL (ref 400–1790)

## 2021-08-14 LAB — HIV-1 RNA QUANT-NO REFLEX-BLD
HIV 1 RNA Quant: 31 copies/mL — ABNORMAL HIGH
HIV-1 RNA Quant, Log: 1.49 Log copies/mL — ABNORMAL HIGH

## 2021-08-24 ENCOUNTER — Other Ambulatory Visit (HOSPITAL_COMMUNITY): Payer: Self-pay

## 2021-08-25 ENCOUNTER — Encounter: Payer: Self-pay | Admitting: Internal Medicine

## 2021-08-25 ENCOUNTER — Other Ambulatory Visit: Payer: Self-pay

## 2021-08-25 ENCOUNTER — Encounter: Payer: BC Managed Care – PPO | Admitting: Internal Medicine

## 2021-08-25 DIAGNOSIS — K8010 Calculus of gallbladder with chronic cholecystitis without obstruction: Secondary | ICD-10-CM | POA: Diagnosis not present

## 2021-08-26 ENCOUNTER — Encounter: Payer: Self-pay | Admitting: Internal Medicine

## 2021-08-26 ENCOUNTER — Telehealth (INDEPENDENT_AMBULATORY_CARE_PROVIDER_SITE_OTHER): Payer: BC Managed Care – PPO | Admitting: Internal Medicine

## 2021-08-26 ENCOUNTER — Other Ambulatory Visit: Payer: Self-pay

## 2021-08-26 ENCOUNTER — Other Ambulatory Visit (HOSPITAL_COMMUNITY): Payer: Self-pay

## 2021-08-26 DIAGNOSIS — B20 Human immunodeficiency virus [HIV] disease: Secondary | ICD-10-CM

## 2021-08-26 DIAGNOSIS — R109 Unspecified abdominal pain: Secondary | ICD-10-CM | POA: Diagnosis not present

## 2021-08-26 DIAGNOSIS — D500 Iron deficiency anemia secondary to blood loss (chronic): Secondary | ICD-10-CM | POA: Diagnosis not present

## 2021-08-26 DIAGNOSIS — F17200 Nicotine dependence, unspecified, uncomplicated: Secondary | ICD-10-CM | POA: Diagnosis not present

## 2021-08-26 MED ORDER — GENVOYA 150-150-200-10 MG PO TABS
1.0000 | ORAL_TABLET | Freq: Every day | ORAL | 11 refills | Status: DC
  Filled 2021-08-26 – 2021-08-28 (×2): qty 30, 30d supply, fill #0
  Filled 2021-09-22: qty 30, 30d supply, fill #1
  Filled 2021-10-20: qty 30, 30d supply, fill #2
  Filled 2021-11-24 (×2): qty 30, 30d supply, fill #3
  Filled 2021-12-28: qty 30, 30d supply, fill #4
  Filled 2022-01-20 – 2022-01-27 (×2): qty 30, 30d supply, fill #5
  Filled 2022-02-22: qty 30, 30d supply, fill #6
  Filled 2022-03-23: qty 30, 30d supply, fill #7
  Filled 2022-04-26 – 2022-04-27 (×2): qty 30, 30d supply, fill #8
  Filled 2022-05-25: qty 30, 30d supply, fill #9
  Filled 2022-06-29: qty 30, 30d supply, fill #10
  Filled 2022-07-30 (×2): qty 30, 30d supply, fill #11

## 2021-08-26 NOTE — Assessment & Plan Note (Signed)
She now has quit smoking.  I congratulated her on that.

## 2021-08-26 NOTE — Assessment & Plan Note (Signed)
She continues to do well on Genvoya and no new concerns.  I discussed the results and no concerns.   She will continue with this and can return in 1 year, sooner as indicated.

## 2021-08-26 NOTE — Assessment & Plan Note (Signed)
Some difficulty with early satiety and ? Gastroparesis.  Will refer to GI

## 2021-08-26 NOTE — Progress Notes (Signed)
   Subjective:    Patient ID: Melissa Cruz, female    DOB: 05-Apr-1981, 41 y.o.   MRN: [id number]  I connected with  Korinne Greenstein on 08/26/21 by a video enabled telemedicine application and verified that I am speaking with the correct person using two identifiers.   I discussed the limitations of evaluation and management by telemedicine. The patient expressed understanding and agreed to proceed.  Location: Patient - home Physician - clinic  Duration of visit:  25 minutes  HPI She is called for follow up of HIV She is doing well on Genvoya and has had no new concerns.  CD4 727, viral load just 31 copies.  She is having some issues with early satiety with a concern for motility issues.  She is now on Ozembic.  She has seen her gynecologist for her anemia and no major concerns.  She quit smoking about 5 months ago!   Review of Systems  Constitutional:  Negative for fatigue.  Gastrointestinal:  Negative for diarrhea and nausea.      Objective:   Physical Exam Eyes:     General: No scleral icterus. Pulmonary:     Effort: Pulmonary effort is normal.  Skin:    Findings: No rash.  Neurological:     General: No focal deficit present.     Mental Status: She is alert.  Psychiatric:        Mood and Affect: Mood normal.   SH: no tobacco       Assessment & Plan:

## 2021-08-26 NOTE — Assessment & Plan Note (Signed)
She is now following with her gynecologist and has been stable.  She will continue to follow with her

## 2021-08-28 ENCOUNTER — Other Ambulatory Visit (HOSPITAL_COMMUNITY): Payer: Self-pay

## 2021-09-02 ENCOUNTER — Other Ambulatory Visit (HOSPITAL_COMMUNITY): Payer: Self-pay

## 2021-09-17 ENCOUNTER — Telehealth: Payer: Self-pay

## 2021-09-17 NOTE — Telephone Encounter (Signed)
Patient dropped off a immunization form to be filled out by our office. We do not have all of the patient's immunizations on file on our end. I attempted to contact the patient to let her know I can provide her with what we do have. Patient did not answer and I left a voicemail for the patient to call back. Paper work place in Counsellor in triage. Redell Nazir T Pricilla Loveless

## 2021-09-22 ENCOUNTER — Other Ambulatory Visit (HOSPITAL_COMMUNITY): Payer: Self-pay

## 2021-10-01 ENCOUNTER — Other Ambulatory Visit (HOSPITAL_COMMUNITY): Payer: Self-pay

## 2021-10-19 ENCOUNTER — Ambulatory Visit: Payer: Managed Care, Other (non HMO) | Admitting: Internal Medicine

## 2021-10-20 ENCOUNTER — Other Ambulatory Visit (HOSPITAL_COMMUNITY): Payer: Self-pay

## 2021-10-26 ENCOUNTER — Other Ambulatory Visit (HOSPITAL_COMMUNITY): Payer: Self-pay

## 2021-11-19 ENCOUNTER — Other Ambulatory Visit (HOSPITAL_COMMUNITY): Payer: Self-pay

## 2021-11-23 ENCOUNTER — Other Ambulatory Visit (HOSPITAL_COMMUNITY): Payer: Self-pay

## 2021-11-24 ENCOUNTER — Other Ambulatory Visit (HOSPITAL_COMMUNITY): Payer: Self-pay

## 2021-11-25 ENCOUNTER — Other Ambulatory Visit (HOSPITAL_COMMUNITY): Payer: Self-pay

## 2021-11-26 ENCOUNTER — Other Ambulatory Visit (HOSPITAL_COMMUNITY): Payer: Self-pay

## 2021-11-30 ENCOUNTER — Other Ambulatory Visit (HOSPITAL_COMMUNITY): Payer: Self-pay

## 2021-12-01 ENCOUNTER — Other Ambulatory Visit (HOSPITAL_COMMUNITY): Payer: Self-pay

## 2021-12-14 ENCOUNTER — Other Ambulatory Visit (HOSPITAL_COMMUNITY): Payer: Self-pay

## 2021-12-14 ENCOUNTER — Encounter: Payer: Self-pay | Admitting: Gastroenterology

## 2021-12-15 ENCOUNTER — Other Ambulatory Visit (HOSPITAL_COMMUNITY): Payer: Self-pay

## 2021-12-16 ENCOUNTER — Other Ambulatory Visit (HOSPITAL_COMMUNITY): Payer: Self-pay

## 2021-12-17 ENCOUNTER — Other Ambulatory Visit (HOSPITAL_COMMUNITY): Payer: Self-pay

## 2021-12-23 ENCOUNTER — Other Ambulatory Visit (HOSPITAL_COMMUNITY): Payer: Self-pay

## 2021-12-28 ENCOUNTER — Other Ambulatory Visit (HOSPITAL_COMMUNITY): Payer: Self-pay

## 2022-01-04 ENCOUNTER — Other Ambulatory Visit (HOSPITAL_COMMUNITY): Payer: Self-pay

## 2022-01-06 ENCOUNTER — Other Ambulatory Visit: Payer: Self-pay | Admitting: Internal Medicine

## 2022-01-06 DIAGNOSIS — E782 Mixed hyperlipidemia: Secondary | ICD-10-CM

## 2022-01-15 ENCOUNTER — Ambulatory Visit: Payer: BC Managed Care – PPO | Admitting: Gastroenterology

## 2022-01-15 ENCOUNTER — Encounter: Payer: Self-pay | Admitting: Gastroenterology

## 2022-01-15 VITALS — BP 110/76 | HR 90 | Ht 63.0 in | Wt 235.0 lb

## 2022-01-15 DIAGNOSIS — R110 Nausea: Secondary | ICD-10-CM | POA: Insufficient documentation

## 2022-01-15 DIAGNOSIS — R140 Abdominal distension (gaseous): Secondary | ICD-10-CM | POA: Diagnosis not present

## 2022-01-15 DIAGNOSIS — R1013 Epigastric pain: Secondary | ICD-10-CM | POA: Insufficient documentation

## 2022-01-15 DIAGNOSIS — K5900 Constipation, unspecified: Secondary | ICD-10-CM | POA: Diagnosis not present

## 2022-01-15 NOTE — Progress Notes (Signed)
01/15/2022 Melissa Cruz [id number] 02-Mar-1981   HISTORY OF PRESENT ILLNESS:  This is a 41 year old female who is new to our office.  She is here today at the request of Dr. Linus Salmons, her ID specialist, for evaluation of abdominal discomfort.  She reports nausea, GERD, bloating, constipation and epigastric abdominal pain for the past year or more.  She says that she skipped a dose of her Ozempic and felt so much better.  She has been on Ozempic since March 2021.  She has an appointment with endocrinology next week, has not seen them for a while, and is going to discuss this with them.  Nonetheless, she would like an EGD just to be sure nothing else is going on.  In regards to her constipation she is taking stool softeners and MiraLAX.  She is on omeprazole 20 mg daily.  She says that things that she used to be able to eat she is not able to eat anymore as they cause her a lot of bloating and indigestion.  Says that despite the Ozempic she has actually gained weight.  Says that she just been miserable for at least the past year.   Past Medical History:  Diagnosis Date   Anemia    History of anemia    HIV positive ([place])    Hydradenitis 02/2013   right axilla   Non-insulin dependent type 2 diabetes mellitus ([place])    gestational only per patient   Past Surgical History:  Procedure Laterality Date   CHOLECYSTECTOMY  1999   HYDRADENITIS EXCISION Right 02/14/2013   Procedure: WIDE EXCISION HIDRADENITIS RIGHT AXILLA;  Surgeon: Harl Bowie, MD;  Location: [facility];  Service: General;  Laterality: Right;   TUBAL LIGATION  11/22/2005   WISDOM TOOTH EXTRACTION      reports that she has quit smoking. Her smoking use included cigarettes. She has never used smokeless tobacco. She reports that she does not drink alcohol and does not use drugs. family history includes Cancer in her maternal grandfather and maternal grandmother; Diabetes in her maternal uncle; Lung cancer  in her mother. Allergies  Allergen Reactions   Shellfish Allergy Shortness Of Breath   Hydrocodone Nausea And Vomiting      Outpatient Encounter Medications as of 01/15/2022  Medication Sig   aspirin EC 81 MG tablet Take 81 mg by mouth daily. Swallow whole.   Continuous Blood Gluc Sensor (FREESTYLE LIBRE 3 SENSOR) MISC Apply sensor every 14 days as directed.   elvitegravir-cobicistat-emtricitabine-tenofovir (GENVOYA) 150-150-200-10 MG TABS tablet Take 1 tablet by mouth daily.   gabapentin (NEURONTIN) 300 MG capsule TAKE 1 CAPSULE BY MOUTH 3 TIMES A DAY   omeprazole (PRILOSEC) 20 MG capsule Take 20 mg by mouth daily.   pravastatin (PRAVACHOL) 20 MG tablet TAKE 1 TABLET BY MOUTH EVERY DAY   Semaglutide, 1 MG/DOSE, (OZEMPIC, 1 MG/DOSE,) 4 MG/3ML SOPN Inject 1 mg into the skin once a week.   Probiotic Product (ALIGN PO) Take by mouth. (Patient not taking: Reported on 01/15/2022)   tranexamic acid (LYSTEDA) 650 MG TABS tablet Take 1,300 mg by mouth 3 (three) times daily. (Patient not taking: Reported on 01/15/2022)   No facility-administered encounter medications on file as of 01/15/2022.    REVIEW OF SYSTEMS  : All other systems reviewed and negative except where noted in the History of Present Illness.   PHYSICAL EXAM: BP 110/76   Pulse 90   Ht 5\' 3"  (1.6 m)   Wt 235  lb (106.6 kg)   SpO2 98%   BMI 41.63 kg/m  General: Well developed female in no acute distress Head: Normocephalic and atraumatic Eyes:  Sclerae anicteric, conjunctiva pink. Ears: Normal auditory acuity Lungs: Clear throughout to auscultation; no W/R/R. Heart: Regular rate and rhythm; no M/R/G. Abdomen: Soft, non-distended.  BS present.  Non-tender. Musculoskeletal: Symmetrical with no gross deformities  Skin: No lesions on visible extremities Extremities: No edema  Neurological: Alert oriented x 4, grossly non-focal Psychological:  Alert and cooperative. Normal mood and affect  ASSESSMENT AND  PLAN: *41 year old female complaining of nausea, GERD, bloating, constipation and epigastric abdominal pain for the past year or more.  She says that she skipped a dose of her Ozempic and felt so much better.  She has been on Ozempic since March 2021.  I think that her symptoms are very likely due to her Ozempic.  That was proven when she missed a dose recently.  She has an appointment with endocrinology next week, has not seen them for a while, and is going to discuss this with them.  Nonetheless, she would like an EGD just to be sure nothing else is going on.  We will schedule with Dr. Myrtie Neither.  In regards to her constipation she is taking stool softeners and MiraLAX.  She is on omeprazole 20 mg daily and may need that increased depending what happens with her Ozempic and EGD findings.  The risks, benefits, and alternatives to EGD were discussed with the patient and she consents to proceed.   CC:  Comer, Belia Heman, MD

## 2022-01-15 NOTE — Patient Instructions (Addendum)
You have been scheduled for an endoscopy. Please follow written instructions given to you at your visit today. If you use inhalers (even only as needed), please bring them with you on the day of your procedure.  You have been scheduled for an endoscopy. Please follow written instructions given to you at your visit today. If you use inhalers (even only as needed), please bring them with you on the day of your procedure.  _______________________________________________________  If you are age 66 or older, your body mass index should be between 23-30. Your Body mass index is 41.63 kg/m. If this is out of the aforementioned range listed, please consider follow up with your Primary Care Provider.  If you are age 48 or younger, your body mass index should be between 19-25. Your Body mass index is 41.63 kg/m. If this is out of the aformentioned range listed, please consider follow up with your Primary Care Provider.   ________________________________________________________  The [facility] providers would like to encourage you to use [facility] to communicate with providers for non-urgent requests or questions.  Due to long hold times on the telephone, sending your provider a message by [facility] may be a faster and more efficient way to get a response.  Please allow 48 business hours for a response.  Please remember that this is for non-urgent requests.  _______________________________________________________

## 2022-01-18 NOTE — Progress Notes (Signed)
____________________________________________________________  Attending physician addendum:  Thank you for sending this case to me. I have reviewed the entire note and agree with the plan.  Her hemoglobin A1c was 12.1 in March 2021, but has been 6-7 ever since then, making this less likely to be gastroparesis.  I agree the history and time course seems to favor it being a side effect of her GLP-1 agonist.  Upper endoscopy reasonable to rule out other visible causes.  Wilfrid Lund, MD  ____________________________________________________________

## 2022-01-20 ENCOUNTER — Telehealth: Payer: BC Managed Care – PPO | Admitting: Internal Medicine

## 2022-01-20 ENCOUNTER — Telehealth: Payer: Self-pay

## 2022-01-20 ENCOUNTER — Encounter: Payer: Self-pay | Admitting: Internal Medicine

## 2022-01-20 ENCOUNTER — Other Ambulatory Visit (HOSPITAL_COMMUNITY): Payer: Self-pay

## 2022-01-20 ENCOUNTER — Other Ambulatory Visit: Payer: Self-pay | Admitting: Internal Medicine

## 2022-01-20 VITALS — BP 120/78 | HR 85 | Ht 63.0 in | Wt 234.6 lb

## 2022-01-20 DIAGNOSIS — E1140 Type 2 diabetes mellitus with diabetic neuropathy, unspecified: Secondary | ICD-10-CM

## 2022-01-20 DIAGNOSIS — Z794 Long term (current) use of insulin: Secondary | ICD-10-CM

## 2022-01-20 DIAGNOSIS — E669 Obesity, unspecified: Secondary | ICD-10-CM

## 2022-01-20 DIAGNOSIS — E782 Mixed hyperlipidemia: Secondary | ICD-10-CM

## 2022-01-20 LAB — COMPREHENSIVE METABOLIC PANEL
ALT: 13 U/L (ref 0–35)
AST: 14 U/L (ref 0–37)
Albumin: 4 g/dL (ref 3.5–5.2)
Alkaline Phosphatase: 67 U/L (ref 39–117)
BUN: 9 mg/dL (ref 6–23)
CO2: 26 mEq/L (ref 19–32)
Calcium: 9.2 mg/dL (ref 8.4–10.5)
Chloride: 101 mEq/L (ref 96–112)
Creatinine, Ser: 0.51 mg/dL (ref 0.40–1.20)
GFR: 114.41 mL/min (ref >60.00)
Glucose, Bld: 137 mg/dL — ABNORMAL HIGH (ref 70–99)
Potassium: 4 mEq/L (ref 3.5–5.1)
Sodium: 135 mEq/L (ref 135–145)
Total Bilirubin: 0.3 mg/dL (ref 0.2–1.2)
Total Protein: 7.2 g/dL (ref 6.0–8.3)

## 2022-01-20 LAB — LIPID PANEL
Cholesterol: 161 mg/dL (ref 0–200)
HDL: 49.2 mg/dL (ref >39.00)
LDL Cholesterol: 84 mg/dL (ref 0–99)
NonHDL: 112.22
Total CHOL/HDL Ratio: 3
Triglycerides: 142 mg/dL (ref 0.0–149.0)
VLDL: 28.4 mg/dL (ref 0.0–40.0)

## 2022-01-20 LAB — MICROALBUMIN / CREATININE URINE RATIO
Creatinine,U: 62.4 mg/dL
Microalb Creat Ratio: 1.1 mg/g (ref 0.0–30.0)
Microalb, Ur: <0.7 mg/dL (ref 0.0–1.9)

## 2022-01-20 LAB — POCT GLYCOSYLATED HEMOGLOBIN (HGB A1C): Hemoglobin A1C: 7.7 % — AB (ref 4.0–5.6)

## 2022-01-20 LAB — TSH: TSH: 1.76 u[IU]/mL (ref 0.35–5.50)

## 2022-01-20 MED ORDER — TRULICITY 0.75 MG/0.5ML ~~LOC~~ SOAJ
0.7500 mg | SUBCUTANEOUS | 5 refills | Status: DC
  Filled 2022-01-20: qty 2, 28d supply, fill #0
  Filled 2022-02-22: qty 2, 28d supply, fill #1
  Filled 2022-03-23: qty 2, 28d supply, fill #2
  Filled 2022-04-26: qty 2, 28d supply, fill #3
  Filled 2022-06-17: qty 2, 28d supply, fill #4
  Filled 2022-07-20: qty 2, 28d supply, fill #5

## 2022-01-20 NOTE — Telephone Encounter (Signed)
Received notification from [employer] regarding a prior authorization for Trulicity 0.75MG /0.5ML pen-injectors. Authorization has been APPROVED 01/20/2022 through 01/19/2023.   Per test claim, copay for 28 days supply is $60.00   Key: [id number]

## 2022-01-20 NOTE — Progress Notes (Unsigned)
Patient ID: Melissa Cruz, female   DOB: [birth year], 41 y.o.   MRN: [id number]   HPI: Melissa Cruz is a 41 y.o.-year-old female, initially referred by Dr. Luciana Axe, returning for follow-up for DM, dx in 2014, GDM in ~2008, previously insulin-dependent since 2018, now off insulin since 11/2020, uncontrolled, with long-term complications (PN).  She saw endocrinology in the past (Dr. Talmage Nap), but I do not have these records.  Last visit 7 months ago.  Interim history: No increased urination, blurry vision, nausea, chest pain.   She continues exercise 4x a week. She has nausea/bloating/AP/constipation from Ozempic. She also gained weight despite eating 1 meal a day. She sees GI >> has and EGD coming up.  Reviewed HbA1c levels: Lab Results  Component Value Date   HGBA1C 6.6 (A) 06/15/2021   HGBA1C 7.1 (A) 03/05/2021   HGBA1C 7.5 (A) 09/30/2020   HGBA1C 6.6 (A) 06/03/2020   HGBA1C 6.6 (A) 01/28/2020   HGBA1C 6.4 (A) 10/16/2019   HGBA1C 12.1 (A) 06/28/2019   HGBA1C 8.4 (H) 11/09/2016   HGBA1C 9.0 (H) 08/17/2012   HGBA1C 7.8 (H) 04/27/2012  She was tested for insulin deficiency >> she did have a low C-peptide (no records)  Previously on: She was on NovoLog 70/30 40 units 2x a day. She tried Metformin IR and ER >> significant diarrhea.   We changed her regimen to: - >> off insulin since 11/2020 - Ozempic 0.5 >> 1 mg weekly in a.m. >> significant GI symptoms  She is checking >4x a day with her CGM - she secures it in place with transparent tape:  Prev.: - am: 76, 89, 97-126, 148, 153 >> 95-112 >> 90-120 >> 124, 176 - 2h after b'fast: n/c - before lunch: n/c >> 97, 120 - 2h after lunch: n/c - before dinner: n/c >> 95-112 >> 92-131 >> 113-131 - 2h after dinner: n/c >> 120 >> n/c - bedtime: n/c >> 108, 111, 158 >> n/c >> 102-132, 241 - nighttime: n/c Lowest sugar was 76 >> 95 >> 90 >> 102 >> 87; she has hypoglycemia awareness in the 80s. Highest sugar was 380 ... >> 128  >> 145 >> 241 >> 200s.  Glucometer: One Touch Verio  Pt's meals are: - Breakfast: cereals, eggs, sausage, toast, pancakes (dinner for her) - Lunch: soup, sub, chicken, salad - Dinner:chicken, fish, fries - Snacks: 3-4 She tried Keto diet for 3 mo >> lost weight >> could come off insulin. She works night shift: 8 pm-8 am.  No CKD, last BUN/creatinine:  Lab Results  Component Value Date   BUN 13 02/06/2021   BUN 8 12/03/2019   CREATININE 0.67 02/06/2021   CREATININE 0.60 12/03/2019  Not on ACE inhibitor/ARB  + HL; last set of lipids: Lab Results  Component Value Date   CHOL 163 02/06/2021   HDL 54 02/06/2021   LDLCALC 88 02/06/2021   TRIG 116 02/06/2021   CHOLHDL 3.0 02/06/2021  We started pravastatin 20 mg daily in 01/2020.  - last eye exam was in 10/2021: Reportedly no DR.  - no numbness and tingling in her feet.  Last foot exam 06/2021. She feels that the Mycolog cream that I prescribed for her at previous visits worked very well.  Pt has FH of DM in 2 uncles.  She has a history of HIV infection, for which she is seeing Dr. Luciana Axe. She developed anemia in the past with pica for ice due to multiple fibroids. She was started on Tranexamic acid with  her cycles.  ROS: + see HPI  Past Medical History:  Diagnosis Date   Anemia    History of anemia    HIV positive ([place])    Hydradenitis 02/2013   right axilla   Non-insulin dependent type 2 diabetes mellitus ([place])    gestational only per patient   Past Surgical History:  Procedure Laterality Date   CHOLECYSTECTOMY  1999   HYDRADENITIS EXCISION Right 02/14/2013   Procedure: WIDE EXCISION HIDRADENITIS RIGHT AXILLA;  Surgeon: Harl Bowie, MD;  Location: [facility];  Service: General;  Laterality: Right;   TUBAL LIGATION  11/22/2005   WISDOM TOOTH EXTRACTION     Social History   Socioeconomic History   Marital status: Divorced    Spouse name: Not on file   Number of children: 3: 21, 43, 46 -  06/2019   Years of education: 16   Highest education level: Not on file  Occupational History   RN  hospice   Tobacco Use   Smoking status: Current Every Day Smoker    Packs/day: 0.50    Years: 7.00    Pack years: 3.50    Types: Cigarettes   Smokeless tobacco: Never Used  Substance and Sexual Activity   Alcohol use: No    Alcohol/week: 0.0 standard drinks   Drug use: No   Sexual activity: Yes    Partners: Male    Birth control/protection: Condom    Comment: pt. declined condoms  Other Topics Concern   Not on file  Social History Narrative   Not on file   Social Determinants of Health   Financial Resource Strain:    Difficulty of Paying Living Expenses:   Food Insecurity:    Worried About Charity fundraiser in the Last Year:    Arboriculturist in the Last Year:   Transportation Needs:    Film/video editor (Medical):    Lack of Transportation (Non-Medical):   Physical Activity:    Days of Exercise per Week:    Minutes of Exercise per Session:   Stress:    Feeling of Stress :   Social Connections:    Frequency of Communication with Friends and Family:    Frequency of Social Gatherings with Friends and Family:    Attends Religious Services:    Active Member of Clubs or Organizations:    Attends Music therapist:    Marital Status:   Intimate Partner Violence:    Fear of Current or Ex-Partner:    Emotionally Abused:    Physically Abused:    Sexually Abused:    Current Outpatient Medications on File Prior to Visit  Medication Sig Dispense Refill   aspirin EC 81 MG tablet Take 81 mg by mouth daily. Swallow whole.     Continuous Blood Gluc Sensor (FREESTYLE LIBRE 3 SENSOR) MISC Apply sensor every 14 days as directed. 6 each 3   elvitegravir-cobicistat-emtricitabine-tenofovir (GENVOYA) 150-150-200-10 MG TABS tablet Take 1 tablet by mouth daily. 30 tablet 11   gabapentin (NEURONTIN) 300 MG capsule TAKE 1 CAPSULE BY MOUTH 3 TIMES A DAY 270 capsule 11    omeprazole (PRILOSEC) 20 MG capsule Take 20 mg by mouth daily.     pravastatin (PRAVACHOL) 20 MG tablet TAKE 1 TABLET BY MOUTH EVERY DAY 90 tablet 3   Probiotic Product (ALIGN PO) Take by mouth. (Patient not taking: Reported on 01/15/2022)     Semaglutide, 1 MG/DOSE, (OZEMPIC, 1 MG/DOSE,) 4 MG/3ML SOPN Inject 1 mg  into the skin once a week. 9 mL 1   tranexamic acid (LYSTEDA) 650 MG TABS tablet Take 1,300 mg by mouth 3 (three) times daily. (Patient not taking: Reported on 01/15/2022)     No current facility-administered medications on file prior to visit.   Allergies  Allergen Reactions   Shellfish Allergy Shortness Of Breath   Hydrocodone Nausea And Vomiting   Family History  Problem Relation Age of Onset   Lung cancer Mother    Cancer Maternal Grandmother    Cancer Maternal Grandfather    Diabetes Maternal Uncle    Esophageal cancer Neg Hx    Liver disease Neg Hx    Colon cancer Neg Hx    Colon polyps Neg Hx     PE: BP 120/78 (BP Location: Right Arm, Patient Position: Sitting, Cuff Size: Normal)   Pulse 85   Ht 5\' 3"  (1.6 m)   Wt 234 lb 9.6 oz (106.4 kg)   SpO2 99%   BMI 41.56 kg/m  Wt Readings from Last 3 Encounters:  01/20/22 234 lb 9.6 oz (106.4 kg)  01/15/22 235 lb (106.6 kg)  06/15/21 214 lb 12.8 oz (97.4 kg)   Constitutional: overweight, in NAD Eyes:  EOMI, no exophthalmos ENT: no neck masses, no cervical lymphadenopathy Cardiovascular: RRR, No MRG Respiratory: CTA B Musculoskeletal: no deformities Skin:no rashes Neurological: no tremor with outstretched hands  ASSESSMENT: 1. DM, insulin-dependent, uncontrolled, with complications - PN  We r/o type 1 diabetes: Component     Latest Ref Rng & Units 08/14/2019  C-Peptide     0.8- 3.85 ng/mL 1.09  Glucose, Plasma     65 - 99 mg/dL 93  Islet Cell Ab     10/14/2019 Negative  ZNT8 Antibodies     <15 U/mL <10  Glutamic Acid Decarb Ab     <5 IU/mL <5   2. HL  3.  Obesity class I   PLAN:  1. Patient  with longstanding, uncontrolled, type 2 diabetes, with history of noncompliance with medications in the past due to problems with insurance and also being overwhelmed by her diabetes.  Her diabetes control improved after starting weekly GLP-1 receptor agonist and also long-acting insulin, but she was without insulin in the past and also forgetting doses.  At last visit, sugars were fluctuating mostly in the normal range with only an occasional slightly hyperglycemic peaks after meals, but with majority of the blood sugars <180.  I advised her to continue Ozempic at the same dose.  HbA1c was 6.6%, improved. CGM interpretation: -At today's visit, we reviewed her CGM downloads: It appears that 53% of values are in target range (goal >70%), while 47% are higher than 180 (goal <25%), and 0% are lower than 70 (goal <4%).  The calculated average blood sugar is 179.  The projected HbA1c for the next 3 months (GMI) is 7.6%. -Reviewing the CGM trends, sugars are fluctuating within a narrow range, around the upper limit of the target range.  -She tells me that she cannot tolerate Ozempic due to significant GI symptoms including abdominal pain, nausea, vomiting.  She felt some of the symptoms with the lower dose but they accentuated with a higher dose.  She does not feel that she can continue with the medication.  She skipped the medication for 1 week and the symptoms improved.  She does have an EGD coming up with GI. I recommended to stop Ozempic completely for 2 weeks.  Afterwards, since she would want to try another  GLP-1 receptor agonist, I suggested Trulicity at a low dose and increase as tolerated.  She agrees with this plan.  We will also start back on basal insulin at this visit with her how to titrate the dose. - I suggested to:  Patient Instructions  Please restart: - Tresiba 14 units daily (increase by 4 units every 4 days until sugars in am <130)  Stop: - Ozempic  In 2 weeks: - Trulicity 0.75 mg  weekly. Let me know if I can call in the higher dose.  Please return in 4 months.  - we checked her HbA1c: 7.7% (higher) - advised to check sugars at different times of the day - 4x a day, rotating check times - advised for yearly eye exams >> she is UTD - will check annual labs - return to clinic in 4 months  2. HL -Reviewed her latest lipid panel from 02/2021: LDL above our target of less than 70, the rest of the fractions at goal: Lab Results  Component Value Date   CHOL 163 02/06/2021   HDL 54 02/06/2021   LDLCALC 88 02/06/2021   TRIG 116 02/06/2021   CHOLHDL 3.0 02/06/2021  -She is on pravastatin 20 g daily with good tolerance  3.  Obesity class I -We will need to stop Ozempic, unfortunately.  We will try to start Trulicity.  This should also help with weight loss. -She gained 8 pounds before last visit -Since last visit, however, she gained ~20 pounds!  She mentions that she only eats once a day and she exercises 4 times a week and she is not sure why she continues to gain weight.  Part of this could be stopping smoking 10 months ago. -I did recommend the [facility] weight management clinic, however, she tells me that she has tried a weight management clinic before and was given phentermine and did not feel she did well on the program. -We will get TSH at today's visit to rule out hypothyroidism as a potential cause for her weight gain  She does not have a PCP.  I again recommended to establish care with one.  Carlus Pavlov, MD PhD [facility] Endocrinology

## 2022-01-20 NOTE — Patient Instructions (Addendum)
Please restart: - Tresiba 14 units daily (increase by 4 units every 4 days until sugars in am <130)  Stop: - Ozempic  In 2 weeks: - Trulicity 5.40 mg weekly. Let me know if I can call in the higher dose.  Please return in 4 months.

## 2022-01-21 ENCOUNTER — Other Ambulatory Visit: Payer: Self-pay | Admitting: Internal Medicine

## 2022-01-21 ENCOUNTER — Other Ambulatory Visit (HOSPITAL_COMMUNITY): Payer: Self-pay

## 2022-01-21 MED ORDER — TRESIBA FLEXTOUCH 100 UNIT/ML ~~LOC~~ SOPN
14.0000 [IU] | PEN_INJECTOR | Freq: Every day | SUBCUTANEOUS | 5 refills | Status: DC
Start: 2022-01-21 — End: 2023-04-22
  Filled 2022-01-21: qty 9, 50d supply, fill #0
  Filled 2022-02-22: qty 9, 50d supply, fill #1
  Filled 2022-08-17 – 2022-09-26 (×2): qty 9, 50d supply, fill #2
  Filled 2022-09-29: qty 6, 30d supply, fill #2
  Filled 2022-11-27: qty 9, 50d supply, fill #2

## 2022-01-21 NOTE — Telephone Encounter (Signed)
Spoke with Belenda Cruise in regards to correcting the visit type. Her visit was originally entered in as an office visit but it had been switched by an outside source after the visit was over. She has put in a ticket to have corrected

## 2022-01-22 ENCOUNTER — Other Ambulatory Visit (HOSPITAL_COMMUNITY): Payer: Self-pay

## 2022-01-26 ENCOUNTER — Ambulatory Visit (AMBULATORY_SURGERY_CENTER): Payer: BC Managed Care – PPO | Admitting: Gastroenterology

## 2022-01-26 ENCOUNTER — Other Ambulatory Visit (HOSPITAL_COMMUNITY): Payer: Self-pay

## 2022-01-26 ENCOUNTER — Encounter: Payer: Self-pay | Admitting: Gastroenterology

## 2022-01-26 VITALS — BP 115/70 | HR 74 | Temp 98.6°F | Resp 20 | Ht 63.6 in | Wt 235.0 lb

## 2022-01-26 DIAGNOSIS — R140 Abdominal distension (gaseous): Secondary | ICD-10-CM | POA: Diagnosis not present

## 2022-01-26 DIAGNOSIS — R110 Nausea: Secondary | ICD-10-CM

## 2022-01-26 DIAGNOSIS — R1013 Epigastric pain: Secondary | ICD-10-CM

## 2022-01-26 DIAGNOSIS — K219 Gastro-esophageal reflux disease without esophagitis: Secondary | ICD-10-CM | POA: Diagnosis not present

## 2022-01-26 MED ORDER — SODIUM CHLORIDE 0.9 % IV SOLN: 500.0000 mL | Freq: Once | INTRAVENOUS | Status: DC

## 2022-01-26 NOTE — Op Note (Signed)
[facility] Patient Name: Melissa Cruz Procedure Date: 01/26/2022 10:23 AM MRN: [id number] Endoscopist: [facility]. Loletha Carrow , MD Age: 41 Referring MD:  Date of Birth: 1980-04-14 Gender: Female Account #: [id number] Procedure:                Upper GI endoscopy Indications:              Epigastric abdominal pain, Abdominal bloating,                            Early satiety, Nausea                           Symptoms significantly improved since stopping                            GLP-1 agonist (patient reports she will be changed                            to a different medicine by her endocrinologist) Medicines:                Monitored Anesthesia Care Procedure:                Pre-Anesthesia Assessment:                           - Prior to the procedure, a History and Physical                            was performed, and patient medications and                            allergies were reviewed. The patient's tolerance of                            previous anesthesia was also reviewed. The risks                            and benefits of the procedure and the sedation                            options and risks were discussed with the patient.                            All questions were answered, and informed consent                            was obtained. Prior Anticoagulants: The patient has                            taken no previous anticoagulant or antiplatelet                            agents. ASA Grade Assessment: III - A patient with  severe systemic disease. After reviewing the risks                            and benefits, the patient was deemed in                            satisfactory condition to undergo the procedure.                           After obtaining informed consent, the endoscope was                            passed under direct vision. Throughout the                            procedure, the patient's blood  pressure, pulse, and                            oxygen saturations were monitored continuously. The                            Endoscope was introduced through the mouth, and                            advanced to the second part of duodenum. The upper                            GI endoscopy was accomplished without difficulty.                            The patient tolerated the procedure well. Scope In: Scope Out: Findings:                 The esophagus was normal.                           The stomach was normal.                           The cardia and gastric fundus were normal on                            retroflexion.                           The examined duodenum was normal. Complications:            No immediate complications. Estimated Blood Loss:     Estimated blood loss: none. Impression:               - Normal esophagus.                           - Normal stomach.                           - Normal examined duodenum.                           -  No specimens collected. Recommendation:           - Patient has a contact number available for                            emergencies. The signs and symptoms of potential                            delayed complications were discussed with the                            patient. Return to normal activities tomorrow.                            Written discharge instructions were provided to the                            patient.                           - Resume previous diet.                           - Continue present medications.                           - Return to my office PRN. Ceaira Ernster L. Myrtie Neither, MD 01/26/2022 10:44:46 AM This report has been signed electronically. CC Letter to:             Gardiner Barefoot, MD

## 2022-01-26 NOTE — Progress Notes (Signed)
To pacu, VSS. Report to [email or web address] 

## 2022-01-26 NOTE — Progress Notes (Signed)
Pt's states no medical or surgical changes since previsit or office visit. 

## 2022-01-26 NOTE — Progress Notes (Signed)
No changes to clinical history since GI office visit on 01/15/22.  The patient is appropriate for an endoscopic procedure in the ambulatory setting.  - Wilfrid Lund, MD

## 2022-01-26 NOTE — Patient Instructions (Signed)
Please read handouts provided. Continue present medications. Return to see Dr. Loletha Carrow as needed.   YOU HAD AN ENDOSCOPIC PROCEDURE TODAY AT [facility]:   Refer to the procedure report that was given to you for any specific questions about what was found during the examination.  If the procedure report does not answer your questions, please call your gastroenterologist to clarify.  If you requested that your care partner not be given the details of your procedure findings, then the procedure report has been included in a sealed envelope for you to review at your convenience later.  YOU SHOULD EXPECT: Some feelings of bloating in the abdomen. Passage of more gas than usual.  Walking can help get rid of the air that was put into your GI tract during the procedure and reduce the bloating. If you had a lower endoscopy (such as a colonoscopy or flexible sigmoidoscopy) you may notice spotting of blood in your stool or on the toilet paper. If you underwent a bowel prep for your procedure, you may not have a normal bowel movement for a few days.  Please Note:  You might notice some irritation and congestion in your nose or some drainage.  This is from the oxygen used during your procedure.  There is no need for concern and it should clear up in a day or so.  SYMPTOMS TO REPORT IMMEDIATELY:  Following upper endoscopy (EGD)  Vomiting of blood or coffee ground material  New chest pain or pain under the shoulder blades  Painful or persistently difficult swallowing  New shortness of breath  Fever of 100F or higher  Black, tarry-looking stools  For urgent or emergent issues, a gastroenterologist can be reached at any hour by calling [phone]. Do not use MyChart messaging for urgent concerns.    DIET:  We do recommend a small meal at first, but then you may proceed to your regular diet.  Drink plenty of fluids but you should avoid alcoholic beverages for 24 hours.  ACTIVITY:  You  should plan to take it easy for the rest of today and you should NOT DRIVE or use heavy machinery until tomorrow (because of the sedation medicines used during the test).    FOLLOW UP: Our staff will call the number listed on your records the next business day following your procedure.  We will call around 7:15- 8:00 am to check on you and address any questions or concerns that you may have regarding the information given to you following your procedure. If we do not reach you, we will leave a message.     If any biopsies were taken you will be contacted by phone or by letter within the next 1-3 weeks.  Please call us at [phone] if you have not heard about the biopsies in 3 weeks.    SIGNATURES/CONFIDENTIALITY: You and/or your care partner have signed paperwork which will be entered into your electronic medical record.  These signatures attest to the fact that that the information above on your After Visit Summary has been reviewed and is understood.  Full responsibility of the confidentiality of this discharge information lies with you and/or your care-partner.

## 2022-01-27 ENCOUNTER — Telehealth: Payer: Self-pay

## 2022-01-27 ENCOUNTER — Other Ambulatory Visit (HOSPITAL_COMMUNITY): Payer: Self-pay

## 2022-01-27 NOTE — Telephone Encounter (Signed)
Left message on answering machine. 

## 2022-01-28 ENCOUNTER — Other Ambulatory Visit (HOSPITAL_COMMUNITY): Payer: Self-pay

## 2022-02-19 ENCOUNTER — Other Ambulatory Visit (HOSPITAL_COMMUNITY): Payer: Self-pay

## 2022-02-22 ENCOUNTER — Other Ambulatory Visit (HOSPITAL_COMMUNITY): Payer: Self-pay

## 2022-02-24 ENCOUNTER — Other Ambulatory Visit (HOSPITAL_COMMUNITY): Payer: Self-pay

## 2022-02-25 IMAGING — US US PELVIS COMPLETE WITH TRANSVAGINAL
1 series · 12 of 25 positions shown · non-contrast
Comparison: None.
COMPARISON: None.

Addendum:
CLINICAL DATA: Uterine fibroids, anemia

EXAM:
TRANSABDOMINAL AND TRANSVAGINAL ULTRASOUND OF PELVIS
DOPPLER ULTRASOUND OF OVARIES
TECHNIQUE: Both transabdominal and transvaginal ultrasound examinations of the
pelvis were performed. Transabdominal technique was performed for
global imaging of the pelvis including uterus, ovaries, adnexal
regions, and pelvic cul-de-sac.
It was necessary to proceed with endovaginal exam following the
transabdominal exam to visualize the uterus and ovaries. Color and
duplex Doppler ultrasound was utilized to evaluate blood flow to the
ovaries.

[Series 1: us pelvis complete with transvaginal · 0.23mm/px · 122 acquisitions, 12 frames shown]
[im 6/122]
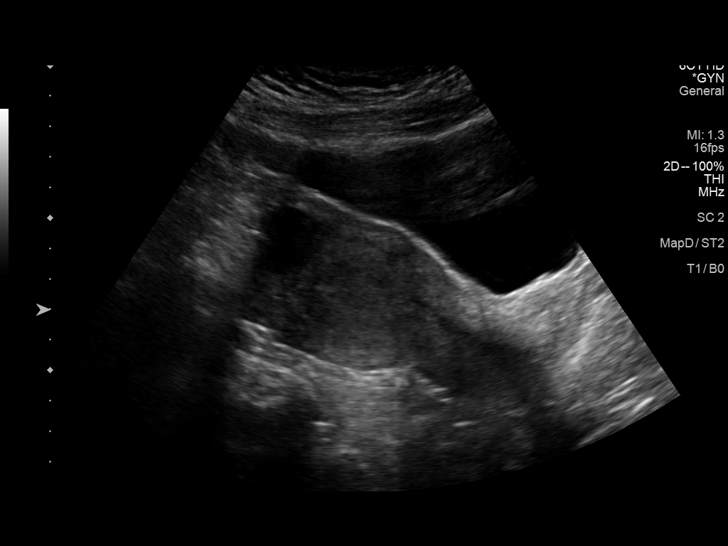
[im 16/122]
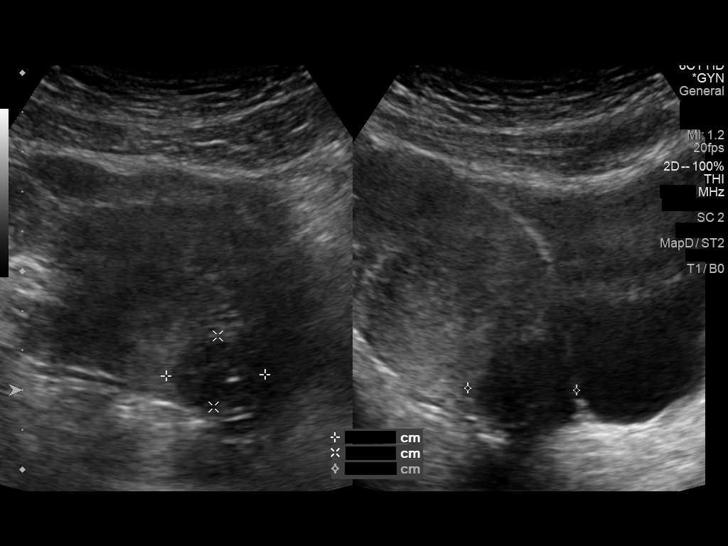
[im 26/122]
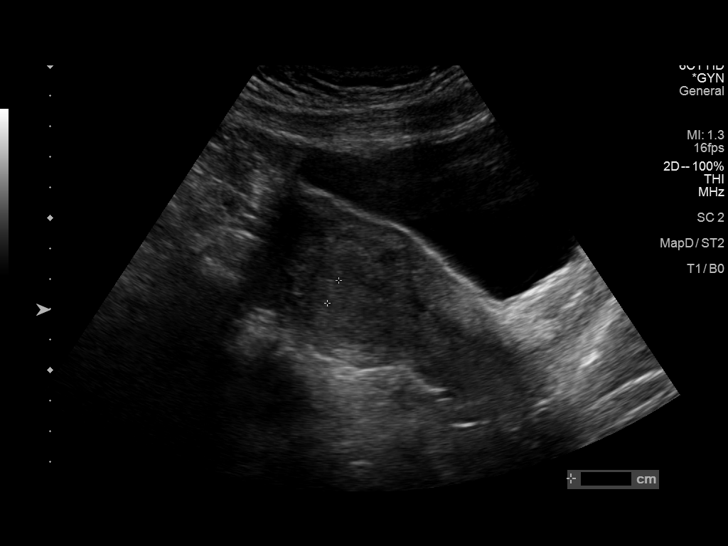
[im 36/122]
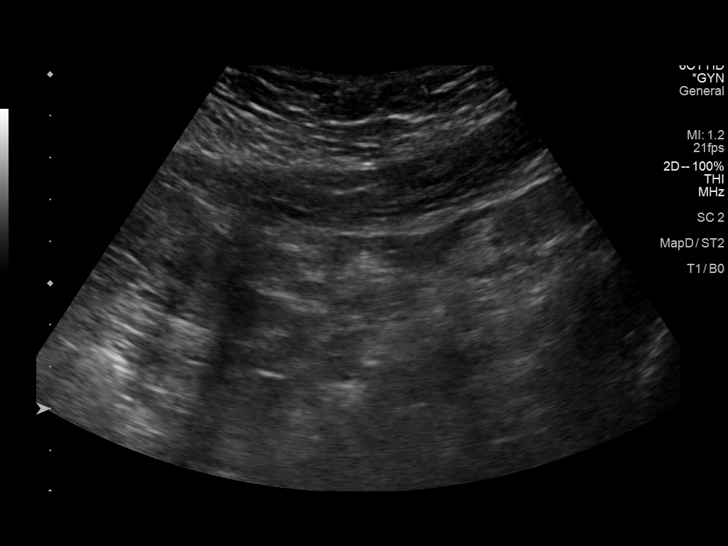
[im 46/122]
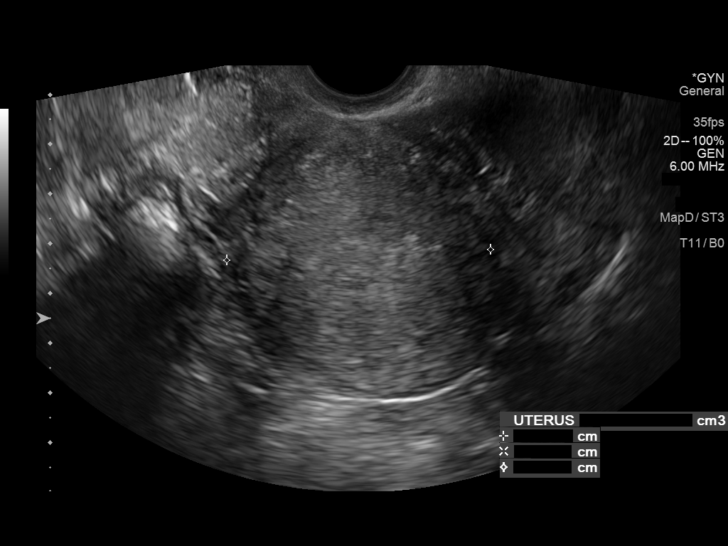
[im 56/122]
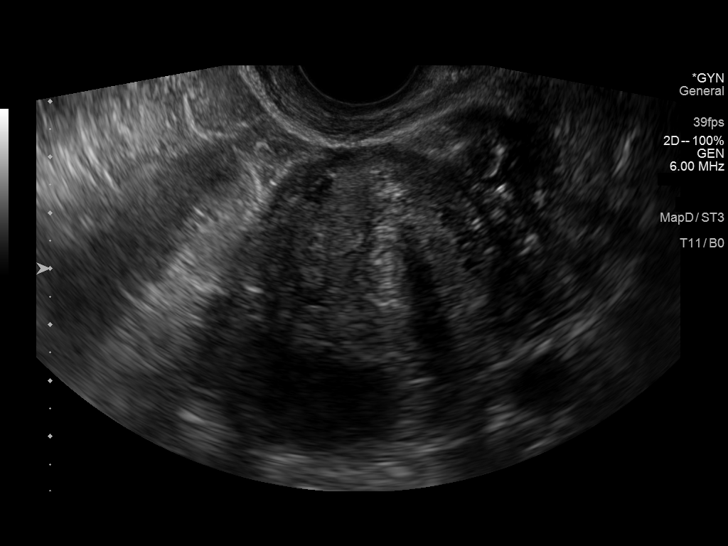
[im 66/122]
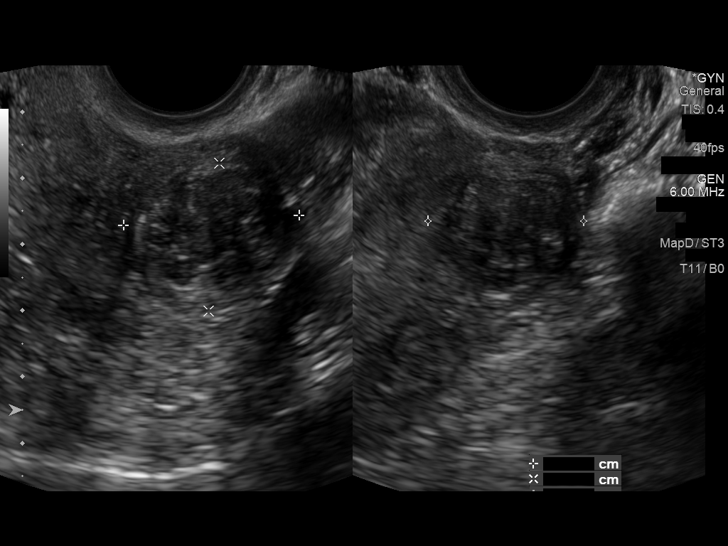
[im 76/122]
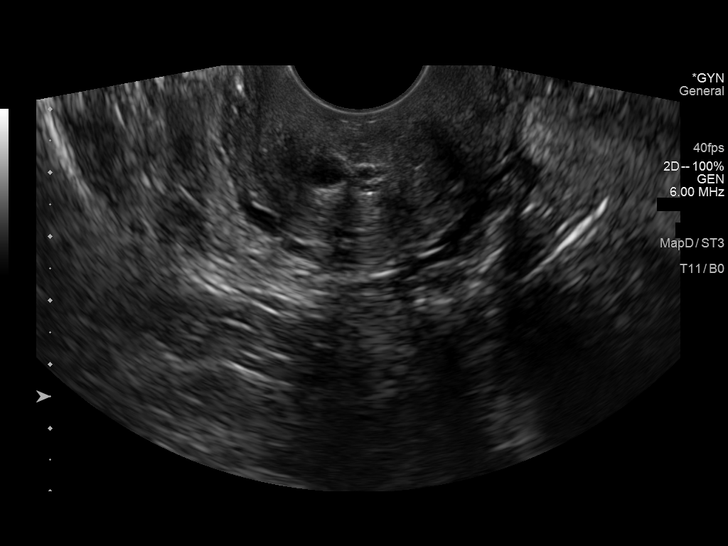
[im 86/122]
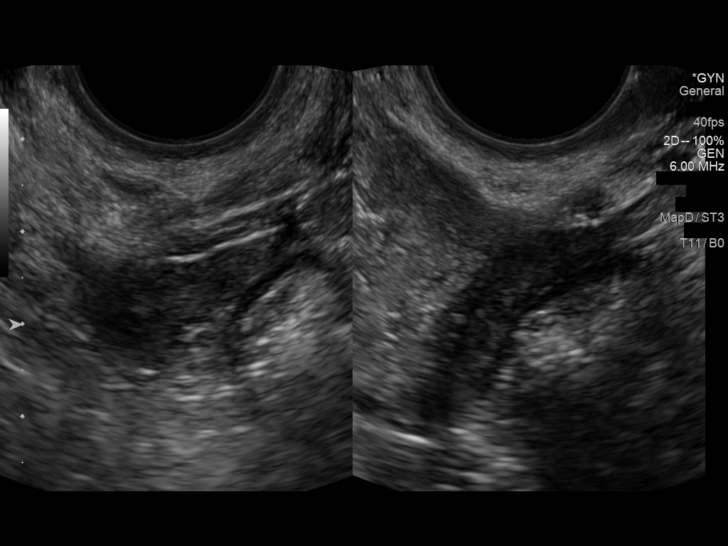
[im 96/122]
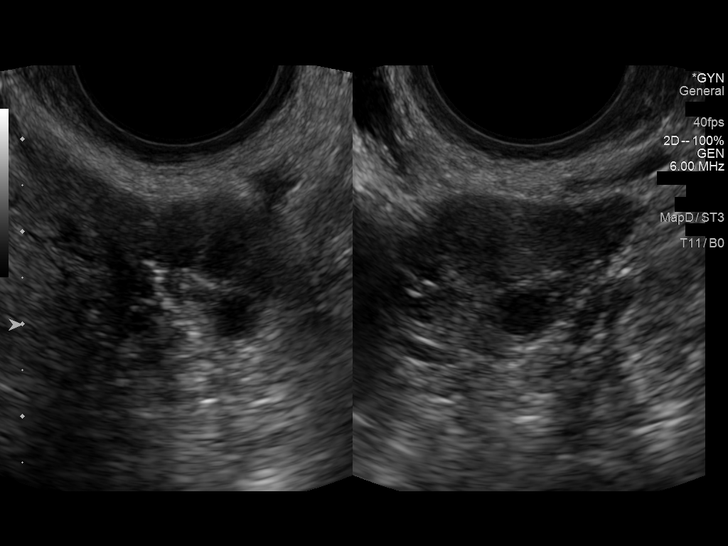
[im 106/122]
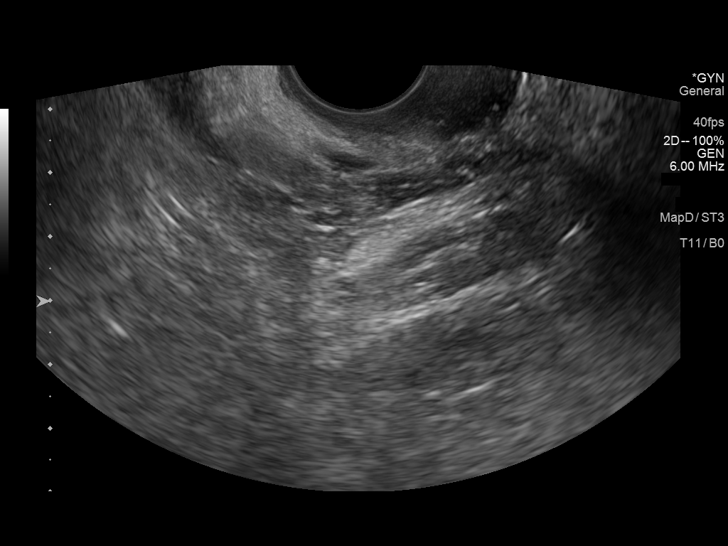
[im 116/122]
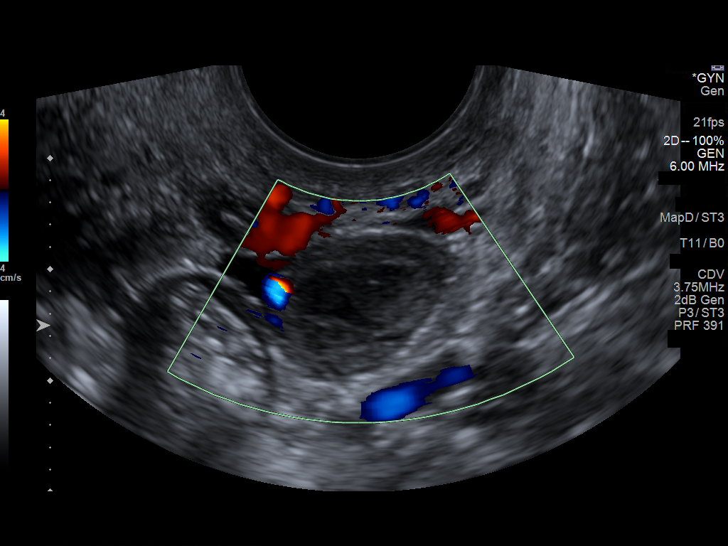

[12 of 25 positions shown; findings below may reference images not displayed]

FINDINGS: Uterus

Measurements: 10.6 x 5 x 5.5 cm = volume: 151 mL. There is
inhomogeneous echogenicity in myometrium with multiple fibroids.
There is 3 x 2.5 cm fibroid in the right posterior fundus. There is
2.2 x 2.2 x 2.4 cm fibroid in the left anterior body. There is 2.5 x
1.8 x 2.7 cm fibroid in the posterior body. There is 2.2 x 2 x
cm fibroid in the posterior body.

Endometrium

Thickness: 12 mm.  No focal abnormality visualized.

Right ovary

Measurements: 2.1 x 1 x 2.2 cm = volume: 2.6 mL. Normal
appearance/no adnexal mass.

Left ovary

Measurements: 1.5 x 1.8 x 2.4 cm = volume: 3.3 mL. There is 1.9 x
1.4 cm echogenic structure in the left adnexa which appears to be
separate from the left ovary. Trace amount of free fluid is seen in
the left adnexa.

Pulsed Doppler evaluation of both ovaries demonstrates normal
low-resistance arterial and venous waveforms.

Other findings

Trace amount of free fluid is seen in the left adnexa.
IMPRESSION: There is inhomogeneous echogenicity in myometrium with multiple
fibroids. Endometrial stripe is prominent which may be due to
secretory phase of menstrual cycle. Evaluation of endometrium is
technically difficult due to multiple uterine fibroids.

There is vascular flow in both adnexal regions. There is 1.9 x
cm echogenic structure in the left adnexa with no definite
demonstrable peristaltic activity. This may suggest a complex
paraovarian cyst or some other inflammatory or neoplastic process.
Short-term pelvic sonogram and contrast enhanced CT if warranted may
be considered.

ADDENDUM:
This is to correct an error in the description of technique and
original report in regards to Doppler examination. Color Doppler
examination was performed but not pulsed Doppler examination in this
current study.

*** End of Addendum ***
FINDINGS: Uterus

Measurements: 10.6 x 5 x 5.5 cm = volume: 151 mL. There is
inhomogeneous echogenicity in myometrium with multiple fibroids.
There is 3 x 2.5 cm fibroid in the right posterior fundus. There is
2.2 x 2.2 x 2.4 cm fibroid in the left anterior body. There is 2.5 x
1.8 x 2.7 cm fibroid in the posterior body. There is 2.2 x 2 x
cm fibroid in the posterior body.

Endometrium

Thickness: 12 mm.  No focal abnormality visualized.

Right ovary

Measurements: 2.1 x 1 x 2.2 cm = volume: 2.6 mL. Normal
appearance/no adnexal mass.

Left ovary

Measurements: 1.5 x 1.8 x 2.4 cm = volume: 3.3 mL. There is 1.9 x
1.4 cm echogenic structure in the left adnexa which appears to be
separate from the left ovary. Trace amount of free fluid is seen in
the left adnexa.

Pulsed Doppler evaluation of both ovaries demonstrates normal
low-resistance arterial and venous waveforms.

Other findings

Trace amount of free fluid is seen in the left adnexa.
IMPRESSION: There is inhomogeneous echogenicity in myometrium with multiple
fibroids. Endometrial stripe is prominent which may be due to
secretory phase of menstrual cycle. Evaluation of endometrium is
technically difficult due to multiple uterine fibroids.

There is vascular flow in both adnexal regions. There is 1.9 x
cm echogenic structure in the left adnexa with no definite
demonstrable peristaltic activity. This may suggest a complex
paraovarian cyst or some other inflammatory or neoplastic process.
Short-term pelvic sonogram and contrast enhanced CT if warranted may
be considered.

## 2022-02-27 ENCOUNTER — Other Ambulatory Visit (HOSPITAL_COMMUNITY): Payer: Self-pay

## 2022-03-01 ENCOUNTER — Other Ambulatory Visit (HOSPITAL_COMMUNITY): Payer: Self-pay

## 2022-03-14 DIAGNOSIS — R300 Dysuria: Secondary | ICD-10-CM | POA: Diagnosis not present

## 2022-03-16 DIAGNOSIS — N926 Irregular menstruation, unspecified: Secondary | ICD-10-CM | POA: Diagnosis not present

## 2022-03-16 DIAGNOSIS — D259 Leiomyoma of uterus, unspecified: Secondary | ICD-10-CM | POA: Diagnosis not present

## 2022-03-16 DIAGNOSIS — R300 Dysuria: Secondary | ICD-10-CM | POA: Diagnosis not present

## 2022-03-23 ENCOUNTER — Other Ambulatory Visit (HOSPITAL_COMMUNITY): Payer: Self-pay

## 2022-03-25 DIAGNOSIS — Z6841 Body Mass Index (BMI) 40.0 and over, adult: Secondary | ICD-10-CM | POA: Diagnosis not present

## 2022-03-25 DIAGNOSIS — Z01419 Encounter for gynecological examination (general) (routine) without abnormal findings: Secondary | ICD-10-CM | POA: Diagnosis not present

## 2022-03-25 DIAGNOSIS — Z1231 Encounter for screening mammogram for malignant neoplasm of breast: Secondary | ICD-10-CM | POA: Diagnosis not present

## 2022-03-30 ENCOUNTER — Other Ambulatory Visit (HOSPITAL_COMMUNITY): Payer: Self-pay

## 2022-03-30 ENCOUNTER — Other Ambulatory Visit: Payer: Self-pay

## 2022-04-22 ENCOUNTER — Other Ambulatory Visit (HOSPITAL_COMMUNITY): Payer: Self-pay

## 2022-04-26 ENCOUNTER — Other Ambulatory Visit: Payer: Self-pay

## 2022-04-26 ENCOUNTER — Other Ambulatory Visit (HOSPITAL_COMMUNITY): Payer: Self-pay

## 2022-04-26 ENCOUNTER — Other Ambulatory Visit: Payer: Self-pay | Admitting: Internal Medicine

## 2022-04-27 ENCOUNTER — Other Ambulatory Visit (HOSPITAL_COMMUNITY): Payer: Self-pay

## 2022-04-27 ENCOUNTER — Other Ambulatory Visit: Payer: Self-pay

## 2022-04-27 MED ORDER — FREESTYLE LIBRE 3 SENSOR MISC
1.0000 | 3 refills | Status: DC
  Filled 2022-04-27: qty 6, 84d supply, fill #0
  Filled 2022-05-18: qty 2, 28d supply, fill #0
  Filled 2022-09-26: qty 2, 28d supply, fill #1
  Filled 2022-11-29: qty 2, 28d supply, fill #2

## 2022-04-28 ENCOUNTER — Other Ambulatory Visit: Payer: Self-pay

## 2022-05-03 ENCOUNTER — Other Ambulatory Visit: Payer: Self-pay

## 2022-05-19 ENCOUNTER — Other Ambulatory Visit (HOSPITAL_COMMUNITY): Payer: Self-pay

## 2022-05-19 ENCOUNTER — Other Ambulatory Visit: Payer: Self-pay

## 2022-05-19 ENCOUNTER — Encounter (HOSPITAL_COMMUNITY): Payer: Self-pay

## 2022-05-20 ENCOUNTER — Other Ambulatory Visit: Payer: Self-pay

## 2022-05-20 ENCOUNTER — Other Ambulatory Visit (HOSPITAL_COMMUNITY): Payer: Self-pay

## 2022-05-21 ENCOUNTER — Telehealth: Payer: Self-pay

## 2022-05-21 NOTE — Telephone Encounter (Signed)
Inbound letter from [employer] advising PA needed for Trulicity effective [id number].

## 2022-05-25 ENCOUNTER — Other Ambulatory Visit (HOSPITAL_COMMUNITY): Payer: Self-pay

## 2022-05-26 ENCOUNTER — Other Ambulatory Visit (HOSPITAL_COMMUNITY): Payer: Self-pay

## 2022-05-27 ENCOUNTER — Other Ambulatory Visit (HOSPITAL_COMMUNITY): Payer: Self-pay

## 2022-06-18 ENCOUNTER — Other Ambulatory Visit (HOSPITAL_COMMUNITY): Payer: Self-pay

## 2022-06-18 ENCOUNTER — Other Ambulatory Visit: Payer: Self-pay

## 2022-06-21 ENCOUNTER — Other Ambulatory Visit (HOSPITAL_COMMUNITY): Payer: Self-pay

## 2022-06-23 ENCOUNTER — Other Ambulatory Visit (HOSPITAL_COMMUNITY): Payer: Self-pay

## 2022-06-29 ENCOUNTER — Other Ambulatory Visit: Payer: Self-pay

## 2022-06-29 ENCOUNTER — Other Ambulatory Visit (HOSPITAL_COMMUNITY): Payer: Self-pay

## 2022-07-20 ENCOUNTER — Other Ambulatory Visit (HOSPITAL_COMMUNITY): Payer: Self-pay

## 2022-07-20 ENCOUNTER — Other Ambulatory Visit: Payer: Self-pay | Admitting: Internal Medicine

## 2022-07-20 ENCOUNTER — Other Ambulatory Visit: Payer: Self-pay

## 2022-07-20 DIAGNOSIS — G63 Polyneuropathy in diseases classified elsewhere: Secondary | ICD-10-CM

## 2022-07-20 NOTE — Telephone Encounter (Signed)
Okay to continue filling? No PCP on file, thanks.

## 2022-07-21 ENCOUNTER — Other Ambulatory Visit: Payer: Self-pay

## 2022-07-21 MED ORDER — GABAPENTIN 300 MG PO CAPS
300.0000 mg | ORAL_CAPSULE | Freq: Three times a day (TID) | ORAL | 11 refills | Status: DC
  Filled 2022-07-21: qty 270, 90d supply, fill #0
  Filled 2022-11-02: qty 270, 90d supply, fill #1
  Filled 2023-01-21 (×2): qty 270, 90d supply, fill #2
  Filled 2023-05-12: qty 90, 30d supply, fill #3

## 2022-07-22 ENCOUNTER — Telehealth: Payer: Self-pay | Admitting: Pharmacy Technician

## 2022-07-22 ENCOUNTER — Other Ambulatory Visit (HOSPITAL_COMMUNITY): Payer: Self-pay

## 2022-07-22 NOTE — Telephone Encounter (Signed)
Received fax form from [employer] [facility] [place] for Ozempic PA. From what I can tell in her chart, the order was d/c due to negative side effects with the medication. Indexing form to pt's chart, just in case it's needed later on.

## 2022-07-26 ENCOUNTER — Other Ambulatory Visit: Payer: Self-pay

## 2022-07-28 ENCOUNTER — Other Ambulatory Visit (HOSPITAL_COMMUNITY): Payer: Self-pay

## 2022-07-28 ENCOUNTER — Other Ambulatory Visit: Payer: Self-pay

## 2022-07-30 ENCOUNTER — Other Ambulatory Visit: Payer: Self-pay

## 2022-07-30 ENCOUNTER — Other Ambulatory Visit (HOSPITAL_COMMUNITY): Payer: Self-pay

## 2022-08-09 ENCOUNTER — Other Ambulatory Visit: Payer: BC Managed Care – PPO

## 2022-08-10 ENCOUNTER — Other Ambulatory Visit (HOSPITAL_COMMUNITY)
Admission: RE | Admit: 2022-08-10 | Discharge: 2022-08-10 | Disposition: A | Discharge status: Home / Self Care | Payer: BC Managed Care – PPO | Source: Ambulatory Visit | Attending: Internal Medicine | Admitting: Internal Medicine

## 2022-08-10 ENCOUNTER — Other Ambulatory Visit: Payer: Self-pay

## 2022-08-10 ENCOUNTER — Other Ambulatory Visit: Payer: BC Managed Care – PPO

## 2022-08-10 DIAGNOSIS — Z113 Encounter for screening for infections with a predominantly sexual mode of transmission: Secondary | ICD-10-CM

## 2022-08-10 DIAGNOSIS — B20 Human immunodeficiency virus [HIV] disease: Secondary | ICD-10-CM

## 2022-08-10 DIAGNOSIS — Z79899 Other long term (current) drug therapy: Secondary | ICD-10-CM | POA: Diagnosis not present

## 2022-08-11 LAB — URINE CYTOLOGY ANCILLARY ONLY
Chlamydia: NEGATIVE
Comment: NEGATIVE
Comment: NORMAL
Neisseria Gonorrhea: NEGATIVE

## 2022-08-11 LAB — CBC WITH DIFFERENTIAL/PLATELET
Basophils Relative: 0.5 %
Eosinophils Absolute: 90 cells/uL (ref 15–500)
MCH: 23 pg — ABNORMAL LOW (ref 27.0–33.0)
Neutro Abs: 2700 cells/uL (ref 1500–7800)
RDW: 16.1 % — ABNORMAL HIGH (ref 11.0–15.0)

## 2022-08-11 LAB — COMPLETE METABOLIC PANEL WITH GFR
Glucose, Bld: 216 mg/dL — ABNORMAL HIGH (ref 65–99)
Potassium: 4.3 mmol/L (ref 3.5–5.3)
Sodium: 137 mmol/L (ref 135–146)
Total Protein: 6.6 g/dL (ref 6.1–8.1)

## 2022-08-11 LAB — T-HELPER CELL (CD4) - (RCID CLINIC ONLY)
CD4 % Helper T Cell: 33 % (ref 33–65)
CD4 T Cell Abs: 821 /uL (ref 400–1790)

## 2022-08-11 LAB — RPR: RPR Ser Ql: NONREACTIVE

## 2022-08-12 LAB — QUANTIFERON-TB GOLD PLUS: Mitogen-NIL: >10 IU/mL

## 2022-08-13 LAB — COMPLETE METABOLIC PANEL WITH GFR
AG Ratio: 1.4 (calc) (ref 1.0–2.5)
ALT: 10 U/L (ref 6–29)
AST: 12 U/L (ref 10–30)
Albumin: 3.9 g/dL (ref 3.6–5.1)
Alkaline phosphatase (APISO): 87 U/L (ref 31–125)
BUN: 9 mg/dL (ref 7–25)
CO2: 27 mmol/L (ref 20–32)
Calcium: 9.1 mg/dL (ref 8.6–10.2)
Chloride: 101 mmol/L (ref 98–110)
Creat: 0.54 mg/dL (ref 0.50–0.99)
Globulin: 2.7 g/dL (calc) (ref 1.9–3.7)
Total Bilirubin: 0.3 mg/dL (ref 0.2–1.2)
eGFR: 117 mL/min/{1.73_m2} (ref >=60)

## 2022-08-13 LAB — CBC WITH DIFFERENTIAL/PLATELET
Absolute Monocytes: 444 cells/uL (ref 200–950)
Basophils Absolute: 30 cells/uL (ref 0–200)
Eosinophils Relative: 1.5 %
HCT: 33.4 % — ABNORMAL LOW (ref 35.0–45.0)
Hemoglobin: 10.1 g/dL — ABNORMAL LOW (ref 11.7–15.5)
Lymphs Abs: 2736 cells/uL (ref 850–3900)
MCHC: 30.2 g/dL — ABNORMAL LOW (ref 32.0–36.0)
MCV: 75.9 fL — ABNORMAL LOW (ref 80.0–100.0)
MPV: 10.2 fL (ref 7.5–12.5)
Monocytes Relative: 7.4 %
Neutrophils Relative %: 45 %
Platelets: 435 10*3/uL — ABNORMAL HIGH (ref 140–400)
RBC: 4.4 10*6/uL (ref 3.80–5.10)
Total Lymphocyte: 45.6 %
WBC: 6 10*3/uL (ref 3.8–10.8)

## 2022-08-13 LAB — QUANTIFERON-TB GOLD PLUS
NIL: 0.03 IU/mL
QuantiFERON-TB Gold Plus: NEGATIVE
TB1-NIL: 0 IU/mL
TB2-NIL: 0.01 IU/mL

## 2022-08-13 LAB — HIV-1 RNA QUANT-NO REFLEX-BLD
HIV 1 RNA Quant: NOT DETECTED Copies/mL
HIV-1 RNA Quant, Log: NOT DETECTED Log cps/mL

## 2022-08-17 ENCOUNTER — Other Ambulatory Visit: Payer: Self-pay

## 2022-08-17 ENCOUNTER — Other Ambulatory Visit (HOSPITAL_COMMUNITY): Payer: Self-pay

## 2022-08-17 ENCOUNTER — Other Ambulatory Visit: Payer: Self-pay | Admitting: Internal Medicine

## 2022-08-17 MED ORDER — TRULICITY 0.75 MG/0.5ML ~~LOC~~ SOAJ
0.7500 mg | SUBCUTANEOUS | 0 refills | Status: DC
  Filled 2022-08-17: qty 2, 28d supply, fill #0

## 2022-08-18 ENCOUNTER — Other Ambulatory Visit: Payer: Self-pay

## 2022-08-18 ENCOUNTER — Other Ambulatory Visit (HOSPITAL_COMMUNITY): Payer: Self-pay

## 2022-08-19 ENCOUNTER — Other Ambulatory Visit (HOSPITAL_COMMUNITY): Payer: Self-pay

## 2022-08-20 DIAGNOSIS — N859 Noninflammatory disorder of uterus, unspecified: Secondary | ICD-10-CM | POA: Diagnosis not present

## 2022-08-20 DIAGNOSIS — N921 Excessive and frequent menstruation with irregular cycle: Secondary | ICD-10-CM | POA: Diagnosis not present

## 2022-08-20 DIAGNOSIS — N939 Abnormal uterine and vaginal bleeding, unspecified: Secondary | ICD-10-CM | POA: Diagnosis not present

## 2022-08-20 DIAGNOSIS — N840 Polyp of corpus uteri: Secondary | ICD-10-CM | POA: Diagnosis not present

## 2022-08-24 ENCOUNTER — Encounter: Payer: Self-pay | Admitting: Internal Medicine

## 2022-08-24 ENCOUNTER — Other Ambulatory Visit: Payer: Self-pay

## 2022-08-24 ENCOUNTER — Ambulatory Visit: Payer: BC Managed Care – PPO | Admitting: Internal Medicine

## 2022-08-24 ENCOUNTER — Other Ambulatory Visit (HOSPITAL_COMMUNITY): Payer: Self-pay

## 2022-08-24 ENCOUNTER — Ambulatory Visit (INDEPENDENT_AMBULATORY_CARE_PROVIDER_SITE_OTHER): Payer: BC Managed Care – PPO | Admitting: Internal Medicine

## 2022-08-24 VITALS — BP 124/78 | HR 78 | Temp 98.5°F | Resp 16 | Wt 235.8 lb

## 2022-08-24 DIAGNOSIS — Z79899 Other long term (current) drug therapy: Secondary | ICD-10-CM | POA: Diagnosis not present

## 2022-08-24 DIAGNOSIS — F17200 Nicotine dependence, unspecified, uncomplicated: Secondary | ICD-10-CM

## 2022-08-24 DIAGNOSIS — Z113 Encounter for screening for infections with a predominantly sexual mode of transmission: Secondary | ICD-10-CM

## 2022-08-24 DIAGNOSIS — B20 Human immunodeficiency virus [HIV] disease: Secondary | ICD-10-CM | POA: Diagnosis not present

## 2022-08-24 DIAGNOSIS — F17211 Nicotine dependence, cigarettes, in remission: Secondary | ICD-10-CM | POA: Diagnosis not present

## 2022-08-24 MED ORDER — GENVOYA 150-150-200-10 MG PO TABS
1.0000 | ORAL_TABLET | Freq: Every day | ORAL | 11 refills | Status: DC
Start: 2022-08-24 — End: 2023-08-02
  Filled 2022-08-24 – 2022-09-01 (×2): qty 30, 30d supply, fill #0
  Filled 2022-09-24: qty 30, 30d supply, fill #1
  Filled 2022-10-20: qty 30, 30d supply, fill #2
  Filled 2022-11-19: qty 30, 30d supply, fill #3
  Filled 2022-12-22: qty 30, 30d supply, fill #4
  Filled 2023-01-21 (×2): qty 30, 30d supply, fill #5
  Filled 2023-02-25: qty 30, 30d supply, fill #6
  Filled 2023-03-21: qty 30, 30d supply, fill #7
  Filled 2023-04-20: qty 30, 30d supply, fill #8
  Filled 2023-05-17: qty 30, 30d supply, fill #9

## 2022-08-24 NOTE — Assessment & Plan Note (Signed)
Screened negative 

## 2022-08-24 NOTE — Assessment & Plan Note (Signed)
She continues to do well, no new concerns and will continue on Genvoya. Refills provided.   Follow up in 1 year

## 2022-08-24 NOTE — Progress Notes (Signed)
   Subjective:    Patient ID: Melissa Cruz, female    DOB: 07-12-1980, 42 y.o.   MRN: [id number]  HPI Melissa Cruz is here for her yearly follow up for HIV She continues on Genvoya with no missed doses.  No issues with getting or taking her medication.  She remains tobacco free.  She was briefly on Ozempic but stopped due to GI side effects.  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     Review of Systems  Constitutional:  Negative for fatigue.  Gastrointestinal:  Negative for diarrhea and nausea.  Skin:  Negative for rash.       Objective:   Physical Exam Eyes:     General: No scleral icterus. Pulmonary:     Effort: Pulmonary effort is normal.  Skin:    Findings: No rash.  Neurological:     Mental Status: She is alert.   SH: no tobacco        Assessment & Plan:

## 2022-08-24 NOTE — Assessment & Plan Note (Signed)
Li[id panel monitored by her diabetologist

## 2022-08-24 NOTE — Assessment & Plan Note (Signed)
She continues to remain tobacco free and I congratulated her on that.

## 2022-08-25 ENCOUNTER — Other Ambulatory Visit (HOSPITAL_COMMUNITY): Payer: Self-pay

## 2022-08-26 DIAGNOSIS — Z0184 Encounter for antibody response examination: Secondary | ICD-10-CM | POA: Diagnosis not present

## 2022-08-26 DIAGNOSIS — Z23 Encounter for immunization: Secondary | ICD-10-CM | POA: Diagnosis not present

## 2022-09-01 ENCOUNTER — Other Ambulatory Visit: Payer: Self-pay

## 2022-09-01 ENCOUNTER — Other Ambulatory Visit (HOSPITAL_COMMUNITY): Payer: Self-pay

## 2022-09-13 ENCOUNTER — Other Ambulatory Visit (HOSPITAL_COMMUNITY): Payer: Self-pay

## 2022-09-14 ENCOUNTER — Other Ambulatory Visit (HOSPITAL_COMMUNITY): Payer: Self-pay

## 2022-09-14 MED ORDER — TRANEXAMIC ACID 650 MG PO TABS
1300.0000 mg | ORAL_TABLET | Freq: Three times a day (TID) | ORAL | 0 refills | Status: DC
  Filled 2022-09-14: qty 30, 5d supply, fill #0

## 2022-09-22 ENCOUNTER — Other Ambulatory Visit (HOSPITAL_COMMUNITY): Payer: Self-pay

## 2022-09-24 ENCOUNTER — Other Ambulatory Visit (HOSPITAL_COMMUNITY): Payer: Self-pay

## 2022-09-26 ENCOUNTER — Other Ambulatory Visit: Payer: Self-pay | Admitting: Internal Medicine

## 2022-09-27 ENCOUNTER — Other Ambulatory Visit: Payer: Self-pay

## 2022-09-27 ENCOUNTER — Encounter (HOSPITAL_COMMUNITY): Payer: Self-pay

## 2022-09-27 ENCOUNTER — Other Ambulatory Visit (HOSPITAL_COMMUNITY): Payer: Self-pay

## 2022-09-29 ENCOUNTER — Encounter: Payer: Self-pay | Admitting: Internal Medicine

## 2022-09-29 ENCOUNTER — Other Ambulatory Visit: Payer: Self-pay

## 2022-09-29 ENCOUNTER — Other Ambulatory Visit (HOSPITAL_COMMUNITY): Payer: Self-pay

## 2022-09-29 MED ORDER — TRULICITY 0.75 MG/0.5ML ~~LOC~~ SOAJ
0.7500 mg | SUBCUTANEOUS | 1 refills | Status: DC
  Filled 2022-09-29: qty 6, 84d supply, fill #0
  Filled 2022-12-22: qty 6, 84d supply, fill #1

## 2022-10-20 ENCOUNTER — Other Ambulatory Visit (HOSPITAL_COMMUNITY): Payer: Self-pay

## 2022-10-20 DIAGNOSIS — N920 Excessive and frequent menstruation with regular cycle: Secondary | ICD-10-CM | POA: Diagnosis not present

## 2022-10-22 ENCOUNTER — Other Ambulatory Visit: Payer: Self-pay | Admitting: Obstetrics

## 2022-10-22 ENCOUNTER — Other Ambulatory Visit (HOSPITAL_COMMUNITY): Payer: Self-pay

## 2022-10-22 DIAGNOSIS — D500 Iron deficiency anemia secondary to blood loss (chronic): Secondary | ICD-10-CM

## 2022-10-25 ENCOUNTER — Other Ambulatory Visit: Payer: Self-pay

## 2022-10-25 ENCOUNTER — Other Ambulatory Visit (HOSPITAL_COMMUNITY): Payer: Self-pay

## 2022-10-25 ENCOUNTER — Other Ambulatory Visit: Payer: Self-pay | Admitting: Obstetrics

## 2022-10-25 DIAGNOSIS — D649 Anemia, unspecified: Secondary | ICD-10-CM

## 2022-10-26 ENCOUNTER — Non-Acute Institutional Stay (HOSPITAL_COMMUNITY)
Admission: RE | Admit: 2022-10-26 | Discharge: 2022-10-26 | Disposition: A | Discharge status: Home / Self Care | Payer: BC Managed Care – PPO | Source: Ambulatory Visit | Attending: Internal Medicine | Admitting: Internal Medicine

## 2022-10-26 DIAGNOSIS — D649 Anemia, unspecified: Secondary | ICD-10-CM | POA: Insufficient documentation

## 2022-10-26 MED ORDER — ACETAMINOPHEN 500 MG PO TABS
500.0000 mg | ORAL_TABLET | ORAL | Status: DC
  Administered 2022-10-26: 500 mg via ORAL
  Filled 2022-10-26: qty 1

## 2022-10-26 MED ORDER — SODIUM CHLORIDE 0.9 % IV SOLN: INTRAVENOUS | Status: DC | PRN

## 2022-10-26 MED ORDER — DIPHENHYDRAMINE HCL 25 MG PO CAPS
25.0000 mg | ORAL_CAPSULE | ORAL | Status: DC
  Administered 2022-10-26: 25 mg via ORAL
  Filled 2022-10-26: qty 1

## 2022-10-26 MED ORDER — IRON SUCROSE 500 MG IVPB - SIMPLE MED
500.0000 mg | INTRAVENOUS | Status: DC
  Administered 2022-10-26: 500 mg via INTRAVENOUS
  Filled 2022-10-26 (×2): qty 275

## 2022-10-26 NOTE — Final Progress Note (Signed)
PATIENT CARE CENTER NOTE  Diagnosis: Anemia, unspecified type [D64.9]    Provider: Noland Fordyce, MD   Procedure: Venofer 500 mg (dose #1 of 2)  Note: Patient received Venofer 500 mg infusion via PIV. Pt pre-medicated PO Tylenol and Benadryl per orders. Pt tolerated infusion with no adverse reaction. AVS printed and given to pt. Pt advised that she should return in 2 weeks to receive her second dose, and advised that she can schedule her next appointment at front desk. Pt is alert, oriented, and ambulatory at discharge.

## 2022-11-02 ENCOUNTER — Other Ambulatory Visit: Payer: Self-pay

## 2022-11-10 ENCOUNTER — Non-Acute Institutional Stay (HOSPITAL_COMMUNITY)
Admission: RE | Admit: 2022-11-10 | Discharge: 2022-11-10 | Disposition: A | Discharge status: Home / Self Care | Payer: BC Managed Care – PPO | Source: Ambulatory Visit | Attending: Internal Medicine | Admitting: Internal Medicine

## 2022-11-10 DIAGNOSIS — D649 Anemia, unspecified: Secondary | ICD-10-CM | POA: Diagnosis not present

## 2022-11-10 MED ORDER — DIPHENHYDRAMINE HCL 25 MG PO CAPS
25.0000 mg | ORAL_CAPSULE | Freq: Once | ORAL | Status: AC
  Administered 2022-11-10: 25 mg via ORAL
  Filled 2022-11-10: qty 1

## 2022-11-10 MED ORDER — ACETAMINOPHEN 500 MG PO TABS
500.0000 mg | ORAL_TABLET | Freq: Once | ORAL | Status: AC
  Administered 2022-11-10: 500 mg via ORAL
  Filled 2022-11-10: qty 1

## 2022-11-10 MED ORDER — SODIUM CHLORIDE 0.9 % IV SOLN: INTRAVENOUS | Status: DC | PRN

## 2022-11-10 MED ORDER — IRON SUCROSE 500 MG IVPB - SIMPLE MED
500.0000 mg | Freq: Once | INTRAVENOUS | Status: AC
  Administered 2022-11-10: 500 mg via INTRAVENOUS
  Filled 2022-11-10: qty 500

## 2022-11-10 NOTE — Progress Notes (Signed)
PATIENT CARE CENTER NOTE  Diagnosis: Anemia, unspecified type [D64.9]    Provider: Noland Fordyce, MD   Procedure: Venofer 500 mg (dose #2 of 2)  Note: Patient received Venofer 500 mg infusion via PIV. Pt pre-medicated with PO Tylenol and Benadryl per orders. Pt tolerated infusion with no adverse reaction. AVS offered, but pt declined. Pt is alert, oriented, and ambulatory at discharge. Pt discharged home with spouse.

## 2022-11-17 ENCOUNTER — Other Ambulatory Visit (HOSPITAL_COMMUNITY): Payer: Self-pay

## 2022-11-19 ENCOUNTER — Other Ambulatory Visit (HOSPITAL_COMMUNITY): Payer: Self-pay

## 2022-11-24 ENCOUNTER — Other Ambulatory Visit (HOSPITAL_COMMUNITY): Payer: Self-pay

## 2022-11-29 ENCOUNTER — Encounter (HOSPITAL_COMMUNITY): Payer: Self-pay

## 2022-11-29 ENCOUNTER — Other Ambulatory Visit (HOSPITAL_COMMUNITY): Payer: Self-pay

## 2022-11-29 DIAGNOSIS — N921 Excessive and frequent menstruation with irregular cycle: Secondary | ICD-10-CM | POA: Diagnosis not present

## 2022-11-30 ENCOUNTER — Other Ambulatory Visit: Payer: Self-pay

## 2022-11-30 ENCOUNTER — Other Ambulatory Visit (HOSPITAL_COMMUNITY): Payer: Self-pay

## 2022-12-14 ENCOUNTER — Encounter: Payer: Self-pay | Admitting: Internal Medicine

## 2022-12-14 NOTE — Telephone Encounter (Signed)
Patient called to follow up on MyChart message, says her vaccine appointment is in 30 minutes. Discussed that her viral load is undetectable and CD4 count is healthy at over 800 so there should not be any contraindication to her receiving her MMR vaccine. Patient verbalized understanding and has no further questions.   Sandie Ano, RN

## 2022-12-17 ENCOUNTER — Other Ambulatory Visit (HOSPITAL_COMMUNITY): Payer: Self-pay

## 2022-12-20 ENCOUNTER — Other Ambulatory Visit (HOSPITAL_COMMUNITY): Payer: Self-pay

## 2022-12-22 ENCOUNTER — Other Ambulatory Visit (HOSPITAL_COMMUNITY): Payer: Self-pay

## 2022-12-22 ENCOUNTER — Encounter (HOSPITAL_COMMUNITY): Payer: Self-pay

## 2023-01-20 ENCOUNTER — Other Ambulatory Visit (HOSPITAL_COMMUNITY): Payer: Self-pay

## 2023-01-21 ENCOUNTER — Other Ambulatory Visit (HOSPITAL_BASED_OUTPATIENT_CLINIC_OR_DEPARTMENT_OTHER): Payer: Self-pay

## 2023-01-21 ENCOUNTER — Other Ambulatory Visit: Payer: Self-pay

## 2023-01-21 ENCOUNTER — Other Ambulatory Visit (HOSPITAL_COMMUNITY): Payer: Self-pay

## 2023-01-21 NOTE — Progress Notes (Signed)
Specialty Pharmacy Refill Coordination Note  Melissa Cruz is a 42 y.o. female contacted today regarding refills of specialty medication(s) Elviteg-Cobic-Emtricit-Tenofaf   Patient requested Delivery   Delivery date: 01/28/23   Verified address: [address] [place], [place], [address]   Medication will be filled on 01/27/23.

## 2023-01-24 ENCOUNTER — Other Ambulatory Visit: Payer: Self-pay | Admitting: Internal Medicine

## 2023-01-24 DIAGNOSIS — E782 Mixed hyperlipidemia: Secondary | ICD-10-CM

## 2023-02-01 ENCOUNTER — Other Ambulatory Visit: Payer: Self-pay

## 2023-02-15 ENCOUNTER — Other Ambulatory Visit: Payer: Self-pay

## 2023-02-21 ENCOUNTER — Other Ambulatory Visit: Payer: Self-pay

## 2023-02-23 ENCOUNTER — Other Ambulatory Visit: Payer: Self-pay

## 2023-02-25 ENCOUNTER — Other Ambulatory Visit (HOSPITAL_COMMUNITY): Payer: Self-pay

## 2023-02-25 ENCOUNTER — Other Ambulatory Visit: Payer: Self-pay

## 2023-02-25 ENCOUNTER — Other Ambulatory Visit (HOSPITAL_COMMUNITY): Payer: Self-pay | Admitting: Pharmacy Technician

## 2023-02-25 NOTE — Progress Notes (Signed)
Specialty Pharmacy Ongoing Clinical Assessment Note  Melissa Cruz is a 42 y.o. female who is being followed by the specialty pharmacy service for RxSp HIV   Patient's specialty medication(s) reviewed today: Elviteg-Cobic-Emtricit-Tenofaf   Missed doses in the last 4 weeks: 0   Patient/Caregiver did not have any additional questions or concerns.   Therapeutic benefit summary: Patient is achieving benefit   Adverse events/side effects summary: No adverse events/side effects   Patient's therapy is appropriate to: Continue    Goals Addressed             This Visit's Progress    Achieve Undetectable HIV Viral Load < 20       Patient is on track. Patient will maintain adherence         Follow up:  6 months  Bobette Mo Specialty Pharmacist

## 2023-02-25 NOTE — Progress Notes (Signed)
Specialty Pharmacy Refill Coordination Note  Melissa Cruz is a 42 y.o. female contacted today regarding refills of specialty medication(s) Elviteg-Cobic-Emtricit-Tenofaf   Patient requested Delivery   Delivery date: 03/01/23   Verified address: [address] [place]   Medication will be filled on 02/28/23.

## 2023-02-28 ENCOUNTER — Other Ambulatory Visit: Payer: Self-pay

## 2023-02-28 NOTE — Progress Notes (Signed)
02/28/23 - Insurance is working on override for "Cost exceeds maximum allowed".. informed us to call back in 2-3 business days.  Case #[id number]

## 2023-03-01 ENCOUNTER — Other Ambulatory Visit: Payer: Self-pay

## 2023-03-08 ENCOUNTER — Other Ambulatory Visit (HOSPITAL_COMMUNITY): Payer: Self-pay

## 2023-03-16 ENCOUNTER — Other Ambulatory Visit: Payer: Self-pay

## 2023-03-21 ENCOUNTER — Other Ambulatory Visit: Payer: Self-pay

## 2023-03-21 NOTE — Progress Notes (Signed)
Specialty Pharmacy Refill Coordination Note  Melissa Cruz is a 42 y.o. female contacted today regarding refills of specialty medication(s) Elviteg-Cobic-Emtricit-TenofAF Melissa Cruz)   Patient requested (Patient-Rptd) Delivery   Delivery date: (Patient-Rptd) 04/01/23   Verified address: (Patient-Rptd) [address]   Medication will be filled on 12.26.24.

## 2023-04-04 ENCOUNTER — Other Ambulatory Visit (HOSPITAL_COMMUNITY): Payer: Self-pay

## 2023-04-04 ENCOUNTER — Other Ambulatory Visit: Payer: Self-pay

## 2023-04-04 MED ORDER — VALACYCLOVIR HCL 1 G PO TABS
1.0000 g | ORAL_TABLET | Freq: Two times a day (BID) | ORAL | 0 refills | Status: AC
  Filled 2023-04-04 – 2023-04-11 (×2): qty 20, 10d supply, fill #0

## 2023-04-05 ENCOUNTER — Other Ambulatory Visit (HOSPITAL_BASED_OUTPATIENT_CLINIC_OR_DEPARTMENT_OTHER): Payer: Self-pay

## 2023-04-05 ENCOUNTER — Encounter (HOSPITAL_BASED_OUTPATIENT_CLINIC_OR_DEPARTMENT_OTHER): Payer: Self-pay

## 2023-04-05 ENCOUNTER — Other Ambulatory Visit (HOSPITAL_COMMUNITY): Payer: Self-pay

## 2023-04-08 ENCOUNTER — Other Ambulatory Visit: Payer: Self-pay

## 2023-04-11 ENCOUNTER — Other Ambulatory Visit (HOSPITAL_COMMUNITY): Payer: Self-pay

## 2023-04-13 ENCOUNTER — Other Ambulatory Visit: Payer: Self-pay | Admitting: Internal Medicine

## 2023-04-14 ENCOUNTER — Other Ambulatory Visit: Payer: Self-pay

## 2023-04-20 ENCOUNTER — Other Ambulatory Visit: Payer: Self-pay

## 2023-04-20 NOTE — Progress Notes (Signed)
 Specialty Pharmacy Refill Coordination Note  Melissa Cruz is a 43 y.o. female contacted today regarding refills of specialty medication(s) Elviteg-Cobic-Emtricit-TenofAF (Genvoya )   Patient requested Delivery   Delivery date: 04/26/23   Verified address: [address] [place] [address]   Medication will be filled on 04/25/23.

## 2023-04-22 ENCOUNTER — Other Ambulatory Visit: Payer: Self-pay

## 2023-04-22 ENCOUNTER — Other Ambulatory Visit (HOSPITAL_COMMUNITY): Payer: Self-pay

## 2023-04-22 ENCOUNTER — Encounter: Payer: Self-pay | Admitting: Internal Medicine

## 2023-04-22 ENCOUNTER — Ambulatory Visit: Payer: PRIVATE HEALTH INSURANCE | Admitting: Internal Medicine

## 2023-04-22 ENCOUNTER — Other Ambulatory Visit: Payer: Self-pay | Admitting: Internal Medicine

## 2023-04-22 VITALS — BP 124/70 | HR 96 | Ht 63.6 in | Wt 240.0 lb

## 2023-04-22 DIAGNOSIS — E66813 Obesity, class 3: Secondary | ICD-10-CM

## 2023-04-22 DIAGNOSIS — E1140 Type 2 diabetes mellitus with diabetic neuropathy, unspecified: Secondary | ICD-10-CM | POA: Diagnosis not present

## 2023-04-22 DIAGNOSIS — Z794 Long term (current) use of insulin: Secondary | ICD-10-CM

## 2023-04-22 DIAGNOSIS — E782 Mixed hyperlipidemia: Secondary | ICD-10-CM | POA: Diagnosis not present

## 2023-04-22 LAB — POCT GLYCOSYLATED HEMOGLOBIN (HGB A1C): Hemoglobin A1C: 10.7 % — AB (ref 4.0–5.6)

## 2023-04-22 MED ORDER — TIRZEPATIDE 2.5 MG/0.5ML ~~LOC~~ SOAJ
2.5000 mg | SUBCUTANEOUS | 1 refills | Status: DC
Start: 2023-04-22 — End: 2023-06-03
  Filled 2023-04-22: qty 2, 28d supply, fill #0
  Filled 2023-05-19: qty 2, 28d supply, fill #1

## 2023-04-22 MED ORDER — TRESIBA FLEXTOUCH 100 UNIT/ML ~~LOC~~ SOPN
14.0000 [IU] | PEN_INJECTOR | Freq: Every day | SUBCUTANEOUS | 5 refills | Status: DC
Start: 2023-04-22 — End: 2023-05-06
  Filled 2023-04-22: qty 6, 30d supply, fill #0

## 2023-04-22 NOTE — Progress Notes (Signed)
Patient ID: Melissa Cruz, female   DOB: 10-08-1980, 43 y.o.   MRN: [id number]   HPI: Melissa Cruz is a 43 y.o.-year-old female, initially referred by Dr. Luciana Axe, returning for follow-up for DM, dx in 2014, GDM in ~2008, previously insulin-dependent since 2018, now off insulin since 11/2020, uncontrolled, with long-term complications (PN).  She saw endocrinology in the past (Dr. Talmage Nap), but I do not have these records.  Last visit 1 year and 3 months ago (virtual).  Interim history: No increased urination, blurry vision, chest pain.  Occasional nausea. She continues exercise but not as much as before as she was very busy with school. She had uterine ablation last summer. She previously had IDA (ferritin 3) and  had to have 2 iron infusion.  Reviewed HbA1c levels: Lab Results  Component Value Date   HGBA1C 7.7 (A) 01/20/2022   HGBA1C 6.6 (A) 06/15/2021   HGBA1C 7.1 (A) 03/05/2021   HGBA1C 7.5 (A) 09/30/2020   HGBA1C 6.6 (A) 06/03/2020   HGBA1C 6.6 (A) 01/28/2020   HGBA1C 6.4 (A) 10/16/2019   HGBA1C 12.1 (A) 06/28/2019   HGBA1C 8.4 (H) 11/09/2016   HGBA1C 9.0 (H) 08/17/2012  She was tested for insulin deficiency >> she did have a low C-peptide (no records)  Previously on: She was on NovoLog 70/30 40 units 2x a day. She tried Metformin IR and ER >> significant diarrhea.   We changed her regimen to: - Tresiba 20 >> 28 >> 20 >> 12 units daily >> stopped 11/2020 >> restarted 06/2021: 14 >> 10 units daily - Ozempic 0.5 >> 1 mg weekly in a.m. >> significant GI symptoms >> Trulicity 0.75 mg weekly,  She was checking >4x a day with her CGM.Now off - not checking now.  Previously:  Prev.: - am: 76, 89, 97-126, 148, 153 >> 95-112 >> 90-120 >> 124, 176 - 2h after b'fast: n/c - before lunch: n/c >> 97, 120 - 2h after lunch: n/c - before dinner: n/c >> 95-112 >> 92-131 >> 113-131 - 2h after dinner: n/c >> 120 >> n/c - bedtime: n/c >> 108, 111, 158 >> n/c >> 102-132,  241 - nighttime: n/c Lowest sugar was 76 >> ... 87 >> ? ; she has hypoglycemia awareness in the 80s. Highest sugar was 380 ... >>  241 >> 200s >> ?  Glucometer: One Touch Verio  Pt's meals are: - Breakfast: cereals, eggs, sausage, toast, pancakes (dinner for her) - Lunch: soup, sub, chicken, salad - Dinner:chicken, fish, fries - Snacks: 3-4 She tried Keto diet for 3 mo >> lost weight >> could come off insulin.  No CKD, last BUN/creatinine:  Lab Results  Component Value Date   BUN 9 08/10/2022   BUN 9 01/20/2022   CREATININE 0.54 08/10/2022   CREATININE 0.51 01/20/2022   Lab Results  Component Value Date   MICRALBCREAT 1.1 01/20/2022   MICRALBCREAT 0.7 08/14/2019   MICRALBCREAT 2.6 10/05/2012  Not on ACE inhibitor/ARB  + HL; last set of lipids: Lab Results  Component Value Date   CHOL 161 01/20/2022   HDL 49.20 01/20/2022   LDLCALC 84 01/20/2022   TRIG 142.0 01/20/2022   CHOLHDL 3 01/20/2022  We started pravastatin 20 mg daily in 01/2020.  - last eye exam was in 01/2023: Reportedly no DR.  - no numbness and tingling in her feet.  Last foot exam 06/2021. She feels that the Mycolog cream that I prescribed for her worked very well.  Pt has FH of DM  in 2 uncles.  She has a history of HIV infection, for which she is seeing Dr. Luciana Axe. She developed anemia in the past with pica for ice due to multiple fibroids. She was started on Tranexamic acid with her cycles.  ROS: + see HPI  Past Medical History:  Diagnosis Date   Anemia    History of anemia    HIV positive (HCC)    Hydradenitis 02/2013   right axilla   Non-insulin dependent type 2 diabetes mellitus (HCC)    gestational only per patient   Past Surgical History:  Procedure Laterality Date   CHOLECYSTECTOMY  1999   HYDRADENITIS EXCISION Right 02/14/2013   Procedure: WIDE EXCISION HIDRADENITIS RIGHT AXILLA;  Surgeon: Shelly Rubenstein, MD;  Location: [facility];  Service: General;   Laterality: Right;   TUBAL LIGATION  11/22/2005   WISDOM TOOTH EXTRACTION     Social History   Socioeconomic History   Marital status: Divorced    Spouse name: Not on file   Number of children: 3: 21, 54, 69 - 06/2019   Years of education: 16   Highest education level: Not on file  Occupational History   RN  hospice   Tobacco Use   Smoking status: Current Every Day Smoker    Packs/day: 0.50    Years: 7.00    Pack years: 3.50    Types: Cigarettes   Smokeless tobacco: Never Used  Substance and Sexual Activity   Alcohol use: No    Alcohol/week: 0.0 standard drinks   Drug use: No   Sexual activity: Yes    Partners: Male    Birth control/protection: Condom    Comment: pt. declined condoms  Other Topics Concern   Not on file  Social History Narrative   Not on file   Social Determinants of Health   Financial Resource Strain:    Difficulty of Paying Living Expenses:   Food Insecurity:    Worried About Programme researcher, broadcasting/film/video in the Last Year:    Barista in the Last Year:   Transportation Needs:    Freight forwarder (Medical):    Lack of Transportation (Non-Medical):   Physical Activity:    Days of Exercise per Week:    Minutes of Exercise per Session:   Stress:    Feeling of Stress :   Social Connections:    Frequency of Communication with Friends and Family:    Frequency of Social Gatherings with Friends and Family:    Attends Religious Services:    Active Member of Clubs or Organizations:    Attends Engineer, structural:    Marital Status:   Intimate Partner Violence:    Fear of Current or Ex-Partner:    Emotionally Abused:    Physically Abused:    Sexually Abused:    Current Outpatient Medications on File Prior to Visit  Medication Sig Dispense Refill   aspirin EC 81 MG tablet Take 81 mg by mouth daily. Swallow whole.     Continuous Glucose Sensor (FREESTYLE LIBRE 3 SENSOR) MISC Apply sensor every 14 days as directed. 6 each 3    Dulaglutide (TRULICITY) 0.75 MG/0.5ML SOPN Inject 0.75 mg into the skin once a week in the morning. Need appointment for refills 6 mL 1   elvitegravir-cobicistat-emtricitabine-tenofovir (GENVOYA) 150-150-200-10 MG TABS tablet Take 1 tablet by mouth daily. 30 tablet 11   gabapentin (NEURONTIN) 300 MG capsule Take 1 capsule (300 mg total) by mouth 3 (three) times  daily. 270 capsule 11   insulin degludec (TRESIBA FLEXTOUCH) 100 UNIT/ML FlexTouch Pen Inject 14-18 Units into the skin daily. 9 mL 5   omeprazole (PRILOSEC) 20 MG capsule Take 20 mg by mouth daily.     pravastatin (PRAVACHOL) 20 MG tablet TAKE 1 TABLET BY MOUTH EVERY DAY 90 tablet 3   Probiotic Product (ALIGN PO) Take by mouth. (Patient not taking: Reported on 01/15/2022)     valACYclovir (VALTREX) 1000 MG tablet Take 1 tablet (1,000 mg total) by mouth every 12 (twelve) hours for 10 days as directed. 20 tablet 0   No current facility-administered medications on file prior to visit.   Allergies  Allergen Reactions   Shellfish Allergy Shortness Of Breath   Hydrocodone Nausea And Vomiting   Family History  Problem Relation Age of Onset   Lung cancer Mother    Cancer Maternal Grandmother    Cancer Maternal Grandfather    Diabetes Maternal Uncle    Esophageal cancer Neg Hx    Liver disease Neg Hx    Colon cancer Neg Hx    Colon polyps Neg Hx    PE: BP 124/70   Pulse 96   Ht 5' 3.6" (1.615 m)   Wt 240 lb (108.9 kg)   SpO2 97%   BMI 41.72 kg/m  Wt Readings from Last 3 Encounters:  04/22/23 240 lb (108.9 kg)  08/24/22 235 lb 12.8 oz (107 kg)  01/26/22 235 lb (106.6 kg)   Constitutional: overweight, in NAD Eyes:  EOMI, no exophthalmos ENT: no neck masses, no cervical lymphadenopathy Cardiovascular: Tachycardia, RR, No MRG Respiratory: CTA B Musculoskeletal: no deformities Skin:no rashes Neurological: no tremor with outstretched hands Diabetic Foot Exam - Simple   Simple Foot Form Diabetic Foot exam was performed with  the following findings: Yes 04/22/2023  3:50 PM  Visual Inspection No deformities, no ulcerations, no other skin breakdown bilaterally: Yes Sensation Testing Intact to touch and monofilament testing bilaterally: Yes Pulse Check Posterior Tibialis and Dorsalis pulse intact bilaterally: Yes Comments    ASSESSMENT: 1. DM, insulin-dependent, uncontrolled, with complications - PN  We r/o type 1 diabetes: Component     Latest Ref Rng & Units 08/14/2019  C-Peptide     0.8- 3.85 ng/mL 1.09  Glucose, Plasma     65 - 99 mg/dL 93  Islet Cell Ab     XLK:<4:4 Negative  ZNT8 Antibodies     <15 U/mL <10  Glutamic Acid Decarb Ab     <5 IU/mL <5   2. HL  3.  Obesity class III  PLAN:  1. Patient with longstanding, uncontrolled, type 2 diabetes, with history of noncompliance with medications and visits due to problems with insurance and also being overwhelmed by her diabetes.  She now returns after 1 year and 10 months from the previous visit! -At last visit sugars were fluctuating within a narrow range around the upper limit of the target interval but unfortunately she could not tolerate Ozempic due to significant GI symptoms and she wanted to try another GLP-1 receptor agonist so I suggested Trulicity.  I advised her to start at a low dose and increase as tolerated.  I will also commended to restart [place] at that time.  She was lost to follow-up afterwards.  Her HbA1c was 6.6%, but she had another HbA1c obtained 01/2022 and this was higher, at 7.7%. -At today's visit, she is not checking blood sugars as she is not on the sensor.  She is interested to restart  the sensor when she has it at home.  I strongly advised her to do so. -Upon questioning, she is using a lower dose of Tresiba than recommended, 10 mg daily, but continues on Trulicity low-dose.  I did advise her at last visit to let me know if she could tolerate this but she did not do so.  She is interested in either increasing the dose or  switching to a stronger GLP-1 receptor agonist as she would like to lose weight.  We will go ahead and switch to [facility] at a low dose due to previous GI side effects to Ozempic.  I advised her to let me know in about 3 weeks if we can increase the dose further.  I also advised her to increase the dose of [place] but I could not suggest any other changes as we do not have blood sugar data. - I suggested to:  Patient Instructions  Please increase: - Tresiba 15 units daily  Switch from Trulicity to: - Mounjaro 2.5 mg weekly Please let me know if you tolerate this well. To increase the dose in 3 weeks.  I referred you to the [facility] Weight Management clinic.  Please return in 1.5 months.  - we checked her HbA1c: 10.7% (much higher) - advised to check sugars at different times of the day - 4x a day, rotating check times - advised for yearly eye exams >> she is UTD - Will check an ACR today - return to clinic in 1.4 months  2. HL -Latest lipid panel was reviewed from 01/2022: LDL above target of less than 70, otherwise fractions at goal: Lab Results  Component Value Date   CHOL 161 01/20/2022   HDL 49.20 01/20/2022   LDLCALC 84 01/20/2022   TRIG 142.0 01/20/2022   CHOLHDL 3 01/20/2022  -She is on pravastatin 20 mg daily with good tolerance  -Will recheck her lipid panel today  3.  Obesity class III -In the past I recommended the wt management clinic  -She tried a weight management clinic before and was given phentermine but she did not feel well on the program and stopped. -Unfortunately, she had abdominal pain, nausea, and vomiting with Ozempic so we had to stop at last visit.  I recommended Trulicity.  However, she was lost for follow-up afterwards. -Before last visit she gained approximately 20 pounds.  She mentions that she was only eating once a day and she was exercising 4 times a week.  -At today's visit she tells me that she is quite busy and is exercising less. -She gained 5  pounds since last visit -She is interested in [place], we will try to start and increase as tolerated.  However, I also recommended again a referral to the [facility] weight management clinic and she agrees with this.  Referral placed.  Orders Placed This Encounter  Procedures   Lipid Panel w/reflex Direct LDL   Microalbumin / creatinine urine ratio   Amb Ref to Medical Weight Management   POCT glycosylated hemoglobin (Hb A1C)   Carlus Pavlov, MD PhD [facility] Endocrinology

## 2023-04-22 NOTE — Patient Instructions (Signed)
Please increase: - Tresiba 15 units daily  Switch from Trulicity to: - Mounjaro 2.5 mg weekly Please let me know if you tolerate this well. To increase the dose in 3 weeks.  I referred you to the [facility] Weight Management clinic.  Please return in 1.5 months.

## 2023-04-23 LAB — LIPID PANEL W/REFLEX DIRECT LDL
Cholesterol: 197 mg/dL (ref <200)
HDL: 51 mg/dL (ref >=50)
LDL Cholesterol (Calc): 116 mg/dL — ABNORMAL HIGH
Non-HDL Cholesterol (Calc): 146 mg/dL — ABNORMAL HIGH (ref <130)
Total CHOL/HDL Ratio: 3.9 (calc) (ref <5.0)
Triglycerides: 178 mg/dL — ABNORMAL HIGH (ref <150)

## 2023-04-23 LAB — MICROALBUMIN / CREATININE URINE RATIO
Creatinine, Urine: 144 mg/dL (ref 20–275)
Microalb Creat Ratio: 21 mg/g{creat} (ref <30)
Microalb, Ur: 3 mg/dL

## 2023-04-25 ENCOUNTER — Other Ambulatory Visit (HOSPITAL_COMMUNITY): Payer: Self-pay

## 2023-04-25 ENCOUNTER — Encounter: Payer: Self-pay | Admitting: Internal Medicine

## 2023-04-25 ENCOUNTER — Other Ambulatory Visit: Payer: Self-pay

## 2023-04-25 DIAGNOSIS — E782 Mixed hyperlipidemia: Secondary | ICD-10-CM

## 2023-04-26 ENCOUNTER — Telehealth: Payer: Self-pay

## 2023-04-26 ENCOUNTER — Other Ambulatory Visit (HOSPITAL_COMMUNITY): Payer: Self-pay

## 2023-04-26 NOTE — Telephone Encounter (Signed)
 Pt needs a PA for [employer]

## 2023-04-27 ENCOUNTER — Telehealth: Payer: Self-pay

## 2023-04-27 ENCOUNTER — Other Ambulatory Visit (HOSPITAL_COMMUNITY): Payer: Self-pay

## 2023-04-27 ENCOUNTER — Other Ambulatory Visit: Payer: Self-pay

## 2023-04-27 ENCOUNTER — Other Ambulatory Visit: Payer: Self-pay | Admitting: Internal Medicine

## 2023-04-27 NOTE — Telephone Encounter (Signed)
Pharmacy Patient Advocate Encounter   Received notification from Pt Calls Messages that prior authorization for Greggory Keen is required/requested.   Insurance verification completed.   The patient is insured through [facility] .   Per test claim: PA required; PA submitted to above mentioned insurance via CoverMyMeds Key/confirmation #/EOC [id number] Status is pending

## 2023-04-28 ENCOUNTER — Encounter (HOSPITAL_COMMUNITY): Payer: Self-pay

## 2023-04-28 ENCOUNTER — Other Ambulatory Visit (HOSPITAL_COMMUNITY): Payer: Self-pay

## 2023-04-28 ENCOUNTER — Other Ambulatory Visit: Payer: Self-pay | Admitting: Internal Medicine

## 2023-04-28 MED ORDER — TOUJEO SOLOSTAR 300 UNIT/ML ~~LOC~~ SOPN
16.0000 [IU] | PEN_INJECTOR | Freq: Every day | SUBCUTANEOUS | 3 refills | Status: DC
  Filled 2023-04-28: qty 1.5, 28d supply, fill #0
  Filled 2023-05-19: qty 1.5, 28d supply, fill #1

## 2023-05-06 ENCOUNTER — Other Ambulatory Visit: Payer: Self-pay | Admitting: Internal Medicine

## 2023-05-06 ENCOUNTER — Other Ambulatory Visit: Payer: Self-pay

## 2023-05-12 ENCOUNTER — Other Ambulatory Visit (HOSPITAL_COMMUNITY): Payer: Self-pay

## 2023-05-12 ENCOUNTER — Other Ambulatory Visit: Payer: Self-pay | Admitting: Internal Medicine

## 2023-05-13 ENCOUNTER — Other Ambulatory Visit (HOSPITAL_COMMUNITY): Payer: Self-pay

## 2023-05-13 ENCOUNTER — Other Ambulatory Visit (HOSPITAL_BASED_OUTPATIENT_CLINIC_OR_DEPARTMENT_OTHER): Payer: Self-pay

## 2023-05-13 ENCOUNTER — Other Ambulatory Visit: Payer: Self-pay

## 2023-05-13 ENCOUNTER — Encounter: Payer: Self-pay | Admitting: Pharmacist

## 2023-05-13 MED ORDER — TIRZEPATIDE 5 MG/0.5ML ~~LOC~~ SOAJ
5.0000 mg | SUBCUTANEOUS | 2 refills | Status: DC
  Filled 2023-05-13: qty 2, 28d supply, fill #0

## 2023-05-13 MED ORDER — FREESTYLE LIBRE 3 PLUS SENSOR MISC
1.0000 | 3 refills | Status: AC
  Filled 2023-05-13: qty 6, 90d supply, fill #0
  Filled 2023-05-17: qty 2, 30d supply, fill #0

## 2023-05-13 MED ORDER — FREESTYLE LIBRE 3 SENSOR MISC
1.0000 | 3 refills | Status: DC
  Filled 2023-05-13: qty 2, 28d supply, fill #0

## 2023-05-13 MED ORDER — PRAVASTATIN SODIUM 20 MG PO TABS
40.0000 mg | ORAL_TABLET | Freq: Every day | ORAL | 3 refills | Status: DC
Start: 2023-05-13 — End: 2023-09-14
  Filled 2023-05-13: qty 60, 30d supply, fill #0

## 2023-05-17 ENCOUNTER — Other Ambulatory Visit: Payer: Self-pay

## 2023-05-17 ENCOUNTER — Other Ambulatory Visit (HOSPITAL_COMMUNITY): Payer: Self-pay

## 2023-05-17 NOTE — Progress Notes (Signed)
Specialty Pharmacy Refill Coordination Note  Melissa Cruz is a 43 y.o. female contacted today regarding refills of specialty medication(s) Elviteg-Cobic-Emtricit-TenofAF [name])   Patient requested Delivery   Delivery date: 05/31/23   Verified address: [address] [place] [address]   Medication will be filled on 05/30/23.

## 2023-05-18 ENCOUNTER — Other Ambulatory Visit: Payer: Self-pay

## 2023-05-18 NOTE — Progress Notes (Signed)
Opened in error - refill already setup and patient responded to questionnaire

## 2023-05-19 ENCOUNTER — Other Ambulatory Visit: Payer: Self-pay

## 2023-05-19 ENCOUNTER — Other Ambulatory Visit (HOSPITAL_COMMUNITY): Payer: Self-pay

## 2023-05-23 NOTE — Telephone Encounter (Signed)
Pharmacy Patient Advocate Encounter  Received notification from [facility] that Prior Authorization for Melissa Cruz has been APPROVEd through 04/26/2024   PA #/Case ID/Reference #: [id number]

## 2023-05-24 ENCOUNTER — Other Ambulatory Visit (HOSPITAL_COMMUNITY): Payer: Self-pay

## 2023-06-01 ENCOUNTER — Other Ambulatory Visit: Payer: Self-pay

## 2023-06-03 ENCOUNTER — Encounter: Payer: Self-pay | Admitting: Internal Medicine

## 2023-06-03 ENCOUNTER — Ambulatory Visit: Payer: PRIVATE HEALTH INSURANCE | Admitting: Internal Medicine

## 2023-06-03 VITALS — BP 110/70 | HR 95 | Ht 63.6 in | Wt 240.2 lb

## 2023-06-03 DIAGNOSIS — Z794 Long term (current) use of insulin: Secondary | ICD-10-CM

## 2023-06-03 DIAGNOSIS — E1140 Type 2 diabetes mellitus with diabetic neuropathy, unspecified: Secondary | ICD-10-CM

## 2023-06-03 DIAGNOSIS — E782 Mixed hyperlipidemia: Secondary | ICD-10-CM | POA: Diagnosis not present

## 2023-06-03 DIAGNOSIS — E66811 Obesity, class 1: Secondary | ICD-10-CM

## 2023-06-03 MED ORDER — ONDANSETRON HCL 4 MG PO TABS: 4.0000 mg | ORAL_TABLET | Freq: Three times a day (TID) | ORAL | 0 refills | Status: DC | PRN

## 2023-06-03 NOTE — Patient Instructions (Addendum)
 Please increase: - Toujeo 25 units daily  Please increase: - Mounjaro 5 mg weekly 1 month, then to 7.5 mg weekly, if well tolerate  Please return in 3 months.

## 2023-06-03 NOTE — Progress Notes (Signed)
 Patient ID: Melissa Cruz, female   DOB: [birth year], 43 y.o.   MRN: [id number]   HPI: Melissa Cruz is a 43 y.o.-year-old female, initially referred by Dr. Luciana Axe, returning for follow-up for DM, dx in 2014, GDM in ~2008, insulin-dependent since 2018, uncontrolled, with long-term complications (PN).  She saw endocrinology in the past (Dr. Talmage Nap), but I do not have these records.  Last visit 1.5 months ago.  Previous visit: 1 year and 3 months ago (virtual).  Interim history: No increased urination, blurry vision, chest pain.  Occasional nausea. She continues exercise but not as much as before as she was very busy with school. She is not sleeping she is busy. Works nights.  She is still in school but will graduate next year.  Reviewed HbA1c levels: Lab Results  Component Value Date   HGBA1C 10.7 (A) 04/22/2023   HGBA1C 7.7 (A) 01/20/2022   HGBA1C 6.6 (A) 06/15/2021   HGBA1C 7.1 (A) 03/05/2021   HGBA1C 7.5 (A) 09/30/2020   HGBA1C 6.6 (A) 06/03/2020   HGBA1C 6.6 (A) 01/28/2020   HGBA1C 6.4 (A) 10/16/2019   HGBA1C 12.1 (A) 06/28/2019   HGBA1C 8.4 (H) 11/09/2016  She was tested for insulin deficiency >> she did have a low C-peptide (no records)  Previously on: She was on NovoLog 70/30 40 units 2x a day. She tried Metformin IR and ER >> significant diarrhea.   Then on: - Tresiba 20 >> 28 >> 20 >> 12 units daily >> stopped 11/2020 >> restarted 06/2021: 14 >> 10 units daily - Ozempic 0.5 >> 1 mg weekly in a.m. >> significant GI symptoms >> Trulicity 0.75 mg weekly  At last visit, we changed to: - Tresiba 15 >> Toujeo 20 units daily - Mounjaro 2.5 mg weekly -had slight  nausea when she started, but resolved  She is checking her blood sugars 4 times a day with her CGM-restarted since last visit:  Previously:  Lowest sugar was 76 >> ... 87 >>  89; she has hypoglycemia awareness in the 80s. Highest sugar was 380 ... >>  241 >> 200s >> 300.  Glucometer: One Touch  Verio  Pt's meals are: - Breakfast: cereals, eggs, sausage, toast, pancakes (dinner for her) - Lunch: soup, sub, chicken, salad - Dinner:chicken, fish, fries - Snacks: 3-4 She tried Keto diet for 3 mo >> lost weight >> could come off insulin.  No CKD, last BUN/creatinine:  Lab Results  Component Value Date   BUN 9 08/10/2022   BUN 9 01/20/2022   CREATININE 0.54 08/10/2022   CREATININE 0.51 01/20/2022   Lab Results  Component Value Date   MICRALBCREAT 21 04/22/2023   MICRALBCREAT 1.1 01/20/2022   MICRALBCREAT 0.7 08/14/2019   MICRALBCREAT 2.6 10/05/2012  Not on ACE inhibitor/ARB  + HL; last set of lipids: Lab Results  Component Value Date   CHOL 197 04/22/2023   HDL 51 04/22/2023   LDLCALC 116 (H) 04/22/2023   TRIG 178 (H) 04/22/2023   CHOLHDL 3.9 04/22/2023  We started pravastatin 20 mg daily in 01/2020.  - last eye exam was in 01/2023: Reportedly no DR.  - no numbness and tingling in her feet.  Last foot exam 04/22/2023. She feels that the Mycolog cream that I prescribed for her worked very well.  Pt has FH of DM in 2 uncles.  She has a history of HIV infection, for which she is seeing Dr. Luciana Axe. She developed anemia in the past with pica for ice due to multiple  fibroids. She was started on Tranexamic acid with her cycles.  ROS: + see HPI  Past Medical History:  Diagnosis Date   Anemia    History of anemia    HIV positive (HCC)    Hydradenitis 02/2013   right axilla   Non-insulin dependent type 2 diabetes mellitus (HCC)    gestational only per patient   Past Surgical History:  Procedure Laterality Date   CHOLECYSTECTOMY  1999   HYDRADENITIS EXCISION Right 02/14/2013   Procedure: WIDE EXCISION HIDRADENITIS RIGHT AXILLA;  Surgeon: Shelly Rubenstein, MD;  Location: [facility];  Service: General;  Laterality: Right;   TUBAL LIGATION  11/22/2005   WISDOM TOOTH EXTRACTION     Social History   Socioeconomic History   Marital status:  Divorced    Spouse name: Not on file   Number of children: 3: 21, 18, 69 - 06/2019   Years of education: 16   Highest education level: Not on file  Occupational History   RN  hospice   Tobacco Use   Smoking status: Current Every Day Smoker    Packs/day: 0.50    Years: 7.00    Pack years: 3.50    Types: Cigarettes   Smokeless tobacco: Never Used  Substance and Sexual Activity   Alcohol use: No    Alcohol/week: 0.0 standard drinks   Drug use: No   Sexual activity: Yes    Partners: Male    Birth control/protection: Condom    Comment: pt. declined condoms  Other Topics Concern   Not on file  Social History Narrative   Not on file   Social Determinants of Health   Financial Resource Strain:    Difficulty of Paying Living Expenses:   Food Insecurity:    Worried About Programme researcher, broadcasting/film/video in the Last Year:    Barista in the Last Year:   Transportation Needs:    Freight forwarder (Medical):    Lack of Transportation (Non-Medical):   Physical Activity:    Days of Exercise per Week:    Minutes of Exercise per Session:   Stress:    Feeling of Stress :   Social Connections:    Frequency of Communication with Friends and Family:    Frequency of Social Gatherings with Friends and Family:    Attends Religious Services:    Active Member of Clubs or Organizations:    Attends Engineer, structural:    Marital Status:   Intimate Partner Violence:    Fear of Current or Ex-Partner:    Emotionally Abused:    Physically Abused:    Sexually Abused:    Current Outpatient Medications on File Prior to Visit  Medication Sig Dispense Refill   aspirin EC 81 MG tablet Take 81 mg by mouth daily. Swallow whole.     Continuous Glucose Sensor (FREESTYLE LIBRE 3 PLUS SENSOR) MISC Inject 1 Device into the skin continuous. Change every 15 days 6 each 3   Continuous Glucose Sensor (FREESTYLE LIBRE 3 SENSOR) MISC Apply sensor every 14 days as directed. 6 each 3    elvitegravir-cobicistat-emtricitabine-tenofovir (GENVOYA) 150-150-200-10 MG TABS tablet Take 1 tablet by mouth daily. 30 tablet 11   gabapentin (NEURONTIN) 300 MG capsule Take 1 capsule (300 mg total) by mouth 3 (three) times daily. 270 capsule 11   insulin glargine, 1 Unit Dial, (TOUJEO SOLOSTAR) 300 UNIT/ML Solostar Pen Inject 16 Units into the skin daily. 4.5 mL 3   omeprazole (PRILOSEC) 20  MG capsule Take 20 mg by mouth daily.     pravastatin (PRAVACHOL) 20 MG tablet Take 2 tablets (40 mg total) by mouth daily. 180 tablet 3   Probiotic Product (ALIGN PO) Take by mouth.     tirzepatide (MOUNJARO) 2.5 MG/0.5ML Pen Inject 2.5 mg into the skin once a week. 2 mL 1   tirzepatide (MOUNJARO) 5 MG/0.5ML Pen Inject 5 mg into the skin once a week. 6 mL 2   No current facility-administered medications on file prior to visit.   Allergies  Allergen Reactions   Shellfish Allergy Shortness Of Breath   Hydrocodone Nausea And Vomiting   Family History  Problem Relation Age of Onset   Lung cancer Mother    Cancer Maternal Grandmother    Cancer Maternal Grandfather    Diabetes Maternal Uncle    Esophageal cancer Neg Hx    Liver disease Neg Hx    Colon cancer Neg Hx    Colon polyps Neg Hx    PE: BP 110/70   Pulse 95   Ht 5' 3.6" (1.615 m)   Wt 240 lb 3.2 oz (109 kg)   SpO2 96%   BMI 41.75 kg/m  Wt Readings from Last 3 Encounters:  06/03/23 240 lb 3.2 oz (109 kg)  04/22/23 240 lb (108.9 kg)  08/24/22 235 lb 12.8 oz (107 kg)   Constitutional: overweight, in NAD Eyes:  EOMI, no exophthalmos ENT: no neck masses, no cervical lymphadenopathy Cardiovascular: Tachycardia, RR, No MRG Respiratory: CTA B Musculoskeletal: no deformities Skin:no rashes Neurological: no tremor with outstretched hands  ASSESSMENT: 1. DM, insulin-dependent, uncontrolled, with complications - PN  We r/o type 1 diabetes: Component     Latest Ref Rng & Units 08/14/2019  C-Peptide     0.8- 3.85 ng/mL 1.09   Glucose, Plasma     65 - 99 mg/dL 93  Islet Cell Ab     WUJ:<8:1 Negative  ZNT8 Antibodies     <15 U/mL <10  Glutamic Acid Decarb Ab     <5 IU/mL <5   2. HL  3.  Obesity class III  PLAN:  1. Patient with longstanding, uncontrolled, type 2 diabetes, with history of noncompliance with medications and visits due to problems with insurance and also being overwhelmed by her diabetes.  At last visit she returns after almost 2 years from the previous visit.  HbA1c was very high, at 10.7%.  She was not checking blood sugars and was not on the CGM.  She was taking a lower dose of insulin than recommended and also continued low-dose GLP-1 receptor agonist.  We increased the dose of her Melissa Cruz, switched from Trulicity to Melissa Cruz, recommended to start back on the CGM as soon as possible and I also wrote referred her to the [facility] weight management clinic.  She did not go to the weight management clinic yet, but was able to start [facility].  She had previous GI side effects with Ozempic but not with Mounjaro. CGM interpretation: -At today's visit, we reviewed her CGM downloads: It appears that 23% of values are in target range (goal >70%), while 77% are higher than 180 (goal <25%), and 0% are lower than 70 (goal <4%).  The calculated average blood sugar is 219.  The projected HbA1c for the next 3 months (GMI) is 8.5%. -Reviewing the CGM trends, sugars appear to be fluctuating above the upper limit of the target range with increases after breakfast and dinner and with the highest blood sugars in the  middle of the night.  Upon questioning, she had to increase the dose of Toujeo (this was changed from [place] since last visit per insurance preference).  She was not able to increase the dose of Mounjaro because she got the second supply of the 2.5 mg dose dispensed from the pharmacy and insurance did not cover the 5 mg due to this.  However, she is eligible to pick up the 5 mg dose next week.  She has 2 doses of  the 2.5 mg at home and I advised her to double up on the dose.  I am hoping that we can continue to titrate Mounjaro up.  She had nausea when she started [facility] so I sent a prescription for a few Zofran tablets to her pharmacy for when she increases the dose.  We also discussed about increasing the Toujeo dose more. - I suggested to:  Patient Instructions  Please increase: - Toujeo 25 units daily  Please increase: - Mounjaro 5 mg weekly 1 month, then to 7.5 mg weekly, if well tolerate  Please return in 3 months.  - advised to check sugars at different times of the day - 4x a day, rotating check times - advised for yearly eye exams >> she is UTD - return to clinic in 3 months  2. HL -Her LDL and triglycerides were elevated at last visit: Lab Results  Component Value Date   CHOL 197 04/22/2023   HDL 51 04/22/2023   LDLCALC 116 (H) 04/22/2023   TRIG 178 (H) 04/22/2023   CHOLHDL 3.9 04/22/2023  -I advised her to increase pravastatin from 20 mg to 40 mg daily.  3.  Obesity class III -In the past I recommended the wt management clinic  -She tried a weight management clinic before and was given phentermine but she did not feel well on the program and stopped. -Unfortunately, she had abdominal pain, nausea, and vomiting with Ozempic so we had to stop at last visit.  I recommended Trulicity.  However, she was lost for follow-up afterwards. -At last visit, she gained 5 pounds since the last visit and was interested in [place].  We switched from Trulicity to [facility], now 5 mg weekly, tolerated well.  At that time, I also recommended a referral to the [facility] weight management clinic and she agreed with this.  However, she did not establish care yet.  Carlus Pavlov, MD PhD [facility] Endocrinology

## 2023-06-08 ENCOUNTER — Encounter (INDEPENDENT_AMBULATORY_CARE_PROVIDER_SITE_OTHER): Payer: Self-pay

## 2023-06-17 ENCOUNTER — Other Ambulatory Visit (HOSPITAL_COMMUNITY): Payer: Self-pay

## 2023-06-20 ENCOUNTER — Other Ambulatory Visit: Payer: Self-pay

## 2023-06-20 NOTE — Progress Notes (Signed)
 Patient is now employed by [employer], and care will transfer to them.

## 2023-06-22 ENCOUNTER — Encounter: Payer: Self-pay | Admitting: Internal Medicine

## 2023-06-23 MED ORDER — TIRZEPATIDE 7.5 MG/0.5ML ~~LOC~~ SOAJ: 7.5000 mg | SUBCUTANEOUS | 3 refills | Status: DC

## 2023-07-11 ENCOUNTER — Other Ambulatory Visit: Payer: Self-pay

## 2023-07-11 DIAGNOSIS — B20 Human immunodeficiency virus [HIV] disease: Secondary | ICD-10-CM

## 2023-07-11 DIAGNOSIS — Z113 Encounter for screening for infections with a predominantly sexual mode of transmission: Secondary | ICD-10-CM

## 2023-07-11 DIAGNOSIS — Z79899 Other long term (current) drug therapy: Secondary | ICD-10-CM

## 2023-07-19 ENCOUNTER — Other Ambulatory Visit: Payer: PRIVATE HEALTH INSURANCE

## 2023-07-25 ENCOUNTER — Other Ambulatory Visit: Payer: Self-pay

## 2023-07-25 ENCOUNTER — Other Ambulatory Visit: Payer: PRIVATE HEALTH INSURANCE

## 2023-07-25 DIAGNOSIS — Z113 Encounter for screening for infections with a predominantly sexual mode of transmission: Secondary | ICD-10-CM

## 2023-07-25 DIAGNOSIS — B20 Human immunodeficiency virus [HIV] disease: Secondary | ICD-10-CM

## 2023-07-25 DIAGNOSIS — Z79899 Other long term (current) drug therapy: Secondary | ICD-10-CM

## 2023-07-26 ENCOUNTER — Encounter: Payer: Self-pay | Admitting: Internal Medicine

## 2023-07-26 LAB — C. TRACHOMATIS/N. GONORRHOEAE RNA
C. trachomatis RNA, TMA: NOT DETECTED
N. gonorrhoeae RNA, TMA: NOT DETECTED

## 2023-07-28 LAB — COMPLETE METABOLIC PANEL WITHOUT GFR
AG Ratio: 1.3 (calc) (ref 1.0–2.5)
ALT: 12 U/L (ref 6–29)
AST: 11 U/L (ref 10–30)
Albumin: 3.9 g/dL (ref 3.6–5.1)
Alkaline phosphatase (APISO): 68 U/L (ref 31–125)
BUN: 9 mg/dL (ref 7–25)
CO2: 24 mmol/L (ref 20–32)
Calcium: 9.1 mg/dL (ref 8.6–10.2)
Chloride: 101 mmol/L (ref 98–110)
Creat: 0.5 mg/dL (ref 0.50–0.99)
Globulin: 2.9 g/dL (ref 1.9–3.7)
Glucose, Bld: 158 mg/dL — ABNORMAL HIGH (ref 65–99)
Potassium: 4.3 mmol/L (ref 3.5–5.3)
Sodium: 135 mmol/L (ref 135–146)
Total Bilirubin: 0.3 mg/dL (ref 0.2–1.2)
Total Protein: 6.8 g/dL (ref 6.1–8.1)

## 2023-07-28 LAB — CBC WITH DIFFERENTIAL/PLATELET
Absolute Lymphocytes: 3094 {cells}/uL (ref 850–3900)
Absolute Monocytes: 442 {cells}/uL (ref 200–950)
Basophils Absolute: 33 {cells}/uL (ref 0–200)
Basophils Relative: 0.5 %
Eosinophils Absolute: 130 {cells}/uL (ref 15–500)
Eosinophils Relative: 2 %
HCT: 42 % (ref 35.0–45.0)
Hemoglobin: 13.8 g/dL (ref 11.7–15.5)
MCH: 28.9 pg (ref 27.0–33.0)
MCHC: 32.9 g/dL (ref 32.0–36.0)
MCV: 88.1 fL (ref 80.0–100.0)
MPV: 10.1 fL (ref 7.5–12.5)
Monocytes Relative: 6.8 %
Neutro Abs: 2802 {cells}/uL (ref 1500–7800)
Neutrophils Relative %: 43.1 %
Platelets: 379 10*3/uL (ref 140–400)
RBC: 4.77 10*6/uL (ref 3.80–5.10)
RDW: 12.9 % (ref 11.0–15.0)
Total Lymphocyte: 47.6 %
WBC: 6.5 10*3/uL (ref 3.8–10.8)

## 2023-07-28 LAB — T-HELPER CELLS (CD4) COUNT (NOT AT ARMC)
Absolute CD4: 1005 {cells}/uL (ref 490–1740)
CD4 T Helper %: 34 % (ref 30–61)
Total lymphocyte count: 2977 {cells}/uL (ref 850–3900)

## 2023-07-28 LAB — HIV-1 RNA QUANT-NO REFLEX-BLD
HIV 1 RNA Quant: NOT DETECTED {copies}/mL
HIV-1 RNA Quant, Log: NOT DETECTED {Log_copies}/mL

## 2023-07-28 LAB — RPR: RPR Ser Ql: NONREACTIVE

## 2023-08-02 ENCOUNTER — Ambulatory Visit (INDEPENDENT_AMBULATORY_CARE_PROVIDER_SITE_OTHER): Payer: PRIVATE HEALTH INSURANCE | Admitting: Infectious Diseases

## 2023-08-02 ENCOUNTER — Other Ambulatory Visit: Payer: Self-pay

## 2023-08-02 ENCOUNTER — Ambulatory Visit: Payer: Self-pay | Admitting: Internal Medicine

## 2023-08-02 ENCOUNTER — Encounter: Payer: Self-pay | Admitting: Infectious Diseases

## 2023-08-02 ENCOUNTER — Other Ambulatory Visit: Payer: BC Managed Care – PPO

## 2023-08-02 VITALS — BP 134/88 | HR 84 | Temp 98.0°F | Ht 63.0 in | Wt 240.0 lb

## 2023-08-02 DIAGNOSIS — Z79899 Other long term (current) drug therapy: Secondary | ICD-10-CM | POA: Diagnosis not present

## 2023-08-02 DIAGNOSIS — Z7185 Encounter for immunization safety counseling: Secondary | ICD-10-CM

## 2023-08-02 DIAGNOSIS — Z113 Encounter for screening for infections with a predominantly sexual mode of transmission: Secondary | ICD-10-CM

## 2023-08-02 DIAGNOSIS — B20 Human immunodeficiency virus [HIV] disease: Secondary | ICD-10-CM | POA: Diagnosis not present

## 2023-08-02 DIAGNOSIS — Z0000 Encounter for general adult medical examination without abnormal findings: Secondary | ICD-10-CM | POA: Insufficient documentation

## 2023-08-02 MED ORDER — GENVOYA 150-150-200-10 MG PO TABS
1.0000 | ORAL_TABLET | Freq: Every day | ORAL | 11 refills | Status: AC
Start: 2023-08-02 — End: ?

## 2023-08-02 NOTE — Progress Notes (Addendum)
 [address], [place], [place], [address]                                                                  Phn. [phone]; Fax: [phone]                                                                             Date: 08/02/23  Reason for Visit: Routine HIV care.  HPI: Melissa Cruz is a 43 y.o.old female with a history of HIV ( Initially on Stribild> switched to Genvoya  in 03/02/2016 as better alternative > till date, DM, h/o hidradenitis who is here for fu. Patient previously followed for a long time by Dr Seymour Dapper. This is my first patient encounter.   She reports being diagnosed at age 53. She initially delayed medication but began treatment during pregnancy and resumed after delivery due to updated guidelines. She is currently on Genvoya  with no missed doses or concerns. Her husband is HIV negative. She has quit smoking three years ago. Lives with husband and has adult children. She is planning to go to [facility] [facility] and needs updated quantiferon.   She reports following OBGYN and endo, and had mammogram and pap smear last year. She is interested to get a new PCP for her ca screening. Follows Dentist Had Tdap done last year for school   No other complaints  ROS: As stated in above HPI; all other systems were reviewed and are otherwise negative unless noted below  No reported fever / chills, night sweats, unintentional weight loss, acute visual change, odynophagia, chest pain/pressure, new or worsened SOB or WOB, nausea, vomiting, diarrhea, dysuria, GU discharge, syncope, seizures, red/hot swollen joints, hallucinations / delusions, rashes, new allergies, unusual / excessive bleeding, swollen lymph nodes, or new hospitalizations/ED visits/Urgent Care visits since the pt was last seen.  PMH/  PSH/ FamHx / Social Hx , medications and allergies reviewed and updated as appropriate; please see corresponding tab in EHR / prior notes                                        Current Outpatient Medications on File Prior to Visit  Medication Sig Dispense Refill   aspirin EC 81 MG tablet Take 81 mg by mouth daily. Swallow whole.     Continuous Glucose Sensor (FREESTYLE LIBRE 3 PLUS SENSOR) MISC Inject 1 Device into the skin continuous. Change every 15 days 6 each 3   Continuous Glucose Sensor (FREESTYLE LIBRE 3 SENSOR) MISC Apply sensor every 14 days as directed. 6  each 3   gabapentin  (NEURONTIN ) 300 MG capsule Take 1 capsule (300 mg total) by mouth 3 (three) times daily. 270 capsule 11   insulin  glargine, 1 Unit Dial , (TOUJEO  SOLOSTAR) 300 UNIT/ML Solostar Pen Inject 16 Units into the skin daily. 4.5 mL 3   omeprazole (PRILOSEC) 20 MG capsule Take 20 mg by mouth daily.     pravastatin  (PRAVACHOL ) 20 MG tablet Take 2 tablets (40 mg total) by mouth daily. 180 tablet 3   Probiotic Product (ALIGN PO) Take by mouth.     tirzepatide  (MOUNJARO ) 7.5 MG/0.5ML Pen Inject 7.5 mg into the skin once a week. 6 mL 3   No current facility-administered medications on file prior to visit.    Allergies  Allergen Reactions   Shellfish Allergy Shortness Of Breath   Hydrocodone  Nausea And Vomiting   Past Medical History:  Diagnosis Date   Anemia    History of anemia    HIV positive (HCC)    Hydradenitis 02/2013   right axilla   Non-insulin  dependent type 2 diabetes mellitus (HCC)    gestational only per patient   Past Surgical History:  Procedure Laterality Date   CHOLECYSTECTOMY  1999   HYDRADENITIS EXCISION Right 02/14/2013   Procedure: WIDE EXCISION HIDRADENITIS RIGHT AXILLA;  Surgeon: Rogena Class, MD;  Location: [facility];  Service: General;  Laterality: Right;   TUBAL LIGATION  11/22/2005   WISDOM TOOTH EXTRACTION     Social History   Socioeconomic History   Marital  status: Married    Spouse name: Not on file   Number of children: 3   Years of education: 16   Highest education level: Not on file  Occupational History    Employer: [employer]   Occupation: RN  Tobacco Use   Smoking status: Former    Types: Cigarettes   Smokeless tobacco: Never   Tobacco comments:    Patient stopped 5 months ago.   Vaping Use   Vaping status: Never Used  Substance and Sexual Activity   Alcohol use: No    Alcohol/week: 0.0 standard drinks of alcohol   Drug use: No   Sexual activity: Yes    Partners: Male    Birth control/protection: Condom  Other Topics Concern   Not on file  Social History Narrative   Not on file   Social Drivers of Health   Financial Resource Strain: Not on file  Food Insecurity: Not on file  Transportation Needs: Not on file  Physical Activity: Not on file  Stress: Not on file  Social Connections: Unknown (08/20/2021)   Received from [facility]   Social Network    Social Network: Not on file  Intimate Partner Violence: Unknown (08/20/2021)   Received from [facility]   HITS    Physically Hurt: Not on file    Insult or Talk Down To: Not on file    Threaten Physical Harm: Not on file    Scream or Curse: Not on file   Family History  Problem Relation Age of Onset   Lung cancer Mother    Cancer Maternal Grandmother    Cancer Maternal Grandfather    Diabetes Maternal Uncle    Esophageal cancer Neg Hx    Liver disease Neg Hx    Colon cancer Neg Hx    Colon polyps Neg Hx    Vitals  BP 134/88   Pulse 84   Temp 98 F (36.7 C) (Temporal)  Ht 5\' 3"  (1.6 m)   Wt 240 lb (108.9 kg)   SpO2 99%   BMI 42.51 kg/m   Examination  Gen: no acute distress, morbid obesity  HEENT: [place]/AT, no scleral icterus, no pale conjunctivae, hearing normal, oral mucosa moist Neck: Supple Cardio: Regular rate and rhythm, s1s2 Resp: Pulmonary effort normal in room air, Normal breath sounds  GI: nondistended,  soft and non tender GU: Musc: Extremities: No pedal edema Skin: No rashes Neuro: grossly non focal , awake, alert and oriented * 3  Psych: Calm, cooperative  Lab Results HIV 1 RNA Quant  Date Value  07/25/2023 NOT DETECTED copies/mL  08/10/2022 Not Detected Copies/mL  08/11/2021 31 copies/mL (H)   CD4 T Cell Abs (/uL)  Date Value  08/10/2022 821  08/11/2021 727  12/03/2019 855   No results found for: "HIV1GENOSEQ" Lab Results  Component Value Date   WBC 6.5 07/25/2023   HGB 13.8 07/25/2023   HCT 42.0 07/25/2023   MCV 88.1 07/25/2023   PLT 379 07/25/2023    Lab Results  Component Value Date   CREATININE 0.50 07/25/2023   BUN 9 07/25/2023   NA 135 07/25/2023   K 4.3 07/25/2023   CL 101 07/25/2023   CO2 24 07/25/2023   Lab Results  Component Value Date   ALT 12 07/25/2023   AST 11 07/25/2023   ALKPHOS 67 01/20/2022   BILITOT 0.3 07/25/2023    Lab Results  Component Value Date   CHOL 197 04/22/2023   TRIG 178 (H) 04/22/2023   HDL 51 04/22/2023   LDLCALC 116 (H) 04/22/2023   Lab Results  Component Value Date   HAV NEG 02/15/2012   Lab Results  Component Value Date   HEPBSAG NEGATIVE 02/15/2012   HEPBSAB NEG 02/15/2012   Lab Results  Component Value Date   HCVAB NEGATIVE 02/15/2012   Lab Results  Component Value Date   CHLAMYDIAWP Negative 08/10/2022   N Negative 08/10/2022   No results found for: "GCPROBEAPT" No results found for: "QUANTGOLD"  Health Maintenance: Immunization History  Administered Date(s) Administered   Hep B / HiB 07/01/2011, 09/24/2011   Hepatitis A 03/01/2012   Hepatitis A, Adult 05/15/2013   Hepatitis B 03/05/2004, 04/05/2004, 10/27/2004, 09/24/2010   Influenza Split 12/19/2011   Influenza Whole 02/07/2006   Influenza, Seasonal, Injecte, Preservative Fre 01/03/2013   Influenza,inj,Quad PF,6+ Mos 12/25/2013, 02/05/2018, 12/17/2019   Influenza-Unspecified 01/04/2015, 01/04/2019   MMR 11/30/1988, 09/24/2010    Meningococcal Mcv4o 10/13/2017, 10/17/2018   PFIZER(Purple Top)SARS-COV-2 Vaccination 04/04/2019, 04/25/2019, 03/18/2020   PPD Test 09/07/2010   Pfizer Covid-19 Vaccine Bivalent Booster 19yrs & up 02/16/2021   Pneumococcal Conjugate-13 10/13/2017   Pneumococcal Polysaccharide-23 02/15/2012, 10/17/2018   Td 03/12/2004   Tdap 03/01/2012    Assessment/Plan: # HIV - continue genvoya , refills sent - labs discussed  - Fu in 6 vs12 months whichever patient prefers     # STD Screening  - 4/21 urine GC and RPR negative  # DM/HLD - on medications and statin - Fu with PCP/endocrinology   #Immunization  - Reports having Tdap last year and deferred   # Health maintenance - quantiferon per her request  - Discussed about breast ca, cervical ca and colon ca screening, she will fu with PCP  Patient's labs were reviewed as well as his previous records. Patients questions were addressed and answered. Safe sex counseling done.   I have personally spent 60 minutes involved in face-to-face and non-face-to-face activities for this patient on  the day of the visit. Professional time spent includes the following activities: Preparing to see the patient (review of tests), Obtaining and/or reviewing separately obtained history (admission/discharge record), Performing a medically appropriate examination and/or evaluation , Ordering medications/tests/procedures, referring and communicating with other health care professionals, Documenting clinical information in the EMR, Independently interpreting results (not separately reported), Communicating results to the patient/family/caregiver, Counseling and educating the patient/family/caregiver and Care coordination (not separately reported).   Of note, portions of this note may have been created with voice recognition software. While this note has been edited for accuracy, occasional wrong-word or 'sound-a-like' substitutions may have occurred due to the inherent  limitations of voice recognition software.   Electronically signed by:  Terre Ferri, MD Infectious Disease Physician [facility] [address] [place], [place] [address] Phone: [phone]  Fax: [phone]

## 2023-08-04 LAB — QUANTIFERON-TB GOLD PLUS
Mitogen-NIL: >10 [IU]/mL
NIL: 0.03 [IU]/mL
QuantiFERON-TB Gold Plus: NEGATIVE
TB1-NIL: 0.02 [IU]/mL
TB2-NIL: 0.01 [IU]/mL

## 2023-08-05 ENCOUNTER — Telehealth: Payer: Self-pay

## 2023-08-05 NOTE — Telephone Encounter (Signed)
 Mychart msg sent

## 2023-08-16 ENCOUNTER — Ambulatory Visit: Payer: Self-pay | Admitting: Internal Medicine

## 2023-08-30 ENCOUNTER — Encounter: Payer: Self-pay | Admitting: Internal Medicine

## 2023-08-30 MED ORDER — TIRZEPATIDE 10 MG/0.5ML ~~LOC~~ SOAJ: 10.0000 mg | SUBCUTANEOUS | 3 refills | Status: DC

## 2023-09-14 ENCOUNTER — Ambulatory Visit: Payer: PRIVATE HEALTH INSURANCE | Admitting: Internal Medicine

## 2023-09-14 ENCOUNTER — Encounter: Payer: Self-pay | Admitting: Internal Medicine

## 2023-09-14 VITALS — BP 118/70 | HR 84 | Ht 63.0 in | Wt 237.8 lb

## 2023-09-14 DIAGNOSIS — E1140 Type 2 diabetes mellitus with diabetic neuropathy, unspecified: Secondary | ICD-10-CM

## 2023-09-14 DIAGNOSIS — Z794 Long term (current) use of insulin: Secondary | ICD-10-CM

## 2023-09-14 DIAGNOSIS — E782 Mixed hyperlipidemia: Secondary | ICD-10-CM

## 2023-09-14 DIAGNOSIS — E66813 Obesity, class 3: Secondary | ICD-10-CM

## 2023-09-14 LAB — POCT GLYCOSYLATED HEMOGLOBIN (HGB A1C): Hemoglobin A1C: 7.4 % — AB (ref 4.0–5.6)

## 2023-09-14 MED ORDER — PRAVASTATIN SODIUM 40 MG PO TABS
40.0000 mg | ORAL_TABLET | Freq: Every day | ORAL | Status: AC
Start: 2023-09-14 — End: ?

## 2023-09-14 NOTE — Progress Notes (Signed)
 Patient ID: Melissa Cruz, female   DOB: 27-Jun-1980, 43 y.o.   MRN: [id number]   HPI: Melissa Cruz is a 43 y.o.-year-old female, initially referred by Dr. Seymour Dapper, returning for follow-up for DM, dx in 2014, GDM in ~2008, insulin -dependent since 2018, uncontrolled, with long-term complications (PN).  She saw endocrinology in the past (Dr. Ronelle Coffee), but I do not have these records.  Last visit 4.5 months ago.  Interim history: No increased urination, blurry vision, chest pain.  She had occasional nausea >> resolved. She continues to be quite busy.  She works nights.  She is still in school but will graduate next year. She is exhausted. She was found to have a low vitamin D >> started weekly vitamin D. Also, on iron .  Reviewed HbA1c levels: Lab Results  Component Value Date   HGBA1C 10.7 (A) 04/22/2023   HGBA1C 7.7 (A) 01/20/2022   HGBA1C 6.6 (A) 06/15/2021   HGBA1C 7.1 (A) 03/05/2021   HGBA1C 7.5 (A) 09/30/2020   HGBA1C 6.6 (A) 06/03/2020   HGBA1C 6.6 (A) 01/28/2020   HGBA1C 6.4 (A) 10/16/2019   HGBA1C 12.1 (A) 06/28/2019   HGBA1C 8.4 (H) 11/09/2016  She was tested for insulin  deficiency >> she did have a low C-peptide (no records)  Previously on: She was on NovoLog 70/30 40 units 2x a day. She tried Metformin  IR and ER >> significant diarrhea.   Then on: - Tresiba  20 >> 28 >> 20 >> 12 units daily >> stopped 11/2020 >> restarted 06/2021: 14 >> 10 units daily - Ozempic  0.5 >> 1 mg weekly in a.m. >> significant GI symptoms >> Trulicity  0.75 mg weekly  At last visit, we changed to: - Tresiba  15 >> Toujeo  20 >> 14 units daily - Mounjaro  2.5 mg weekly -had slight  nausea when she started, but resolved >> 5 >> 7.5 >> 10 mg weekly (started last week)  She is checking her blood sugars 4 times a day with her CGM:  Previously:   Lowest sugar was 76 >> ... 87 >>  89 >> 59 (higher Toujeo  dose); she has hypoglycemia awareness in the 80s. Highest sugar was 380 ... >>  241 >>  200s >> 300 >> 200s.  Glucometer: One Touch Verio  Pt's meals are: - Breakfast: cereals, eggs, sausage, toast, pancakes (dinner for her) - Lunch: soup, sub, chicken, salad - Dinner:chicken, fish, fries - Snacks: 3-4 She tried Keto diet for 3 mo >> lost weight >> could come off insulin .  No CKD, last BUN/creatinine:  Lab Results  Component Value Date   BUN 9 07/25/2023   BUN 9 08/10/2022   CREATININE 0.50 07/25/2023   CREATININE 0.54 08/10/2022   Lab Results  Component Value Date   MICRALBCREAT 21 04/22/2023   MICRALBCREAT 1.1 01/20/2022   MICRALBCREAT 0.7 08/14/2019   MICRALBCREAT 2.6 10/05/2012  Not on ACE inhibitor/ARB  + HL; last set of lipids: Lab Results  Component Value Date   CHOL 197 04/22/2023   HDL 51 04/22/2023   LDLCALC 116 (H) 04/22/2023   TRIG 178 (H) 04/22/2023   CHOLHDL 3.9 04/22/2023  We started pravastatin  20 mg daily in 01/2020.  - last eye exam was in 01/2023: Reportedly no DR.  - no numbness and tingling in her feet.  Last foot exam 04/22/2023. She feels that the Mycolog cream that I prescribed for her worked very well.  Pt has FH of DM in 2 uncles.  She has a history of HIV infection, for which she  is seeing Dr. Seymour Dapper. She developed anemia in the past with pica for ice due to multiple fibroids. She was started on Tranexamic acid  with her cycles.  ROS: + see HPI  Past Medical History:  Diagnosis Date   Anemia    History of anemia    HIV positive (HCC)    Hydradenitis 02/2013   right axilla   Non-insulin  dependent type 2 diabetes mellitus (HCC)    gestational only per patient   Past Surgical History:  Procedure Laterality Date   CHOLECYSTECTOMY  1999   HYDRADENITIS EXCISION Right 02/14/2013   Procedure: WIDE EXCISION HIDRADENITIS RIGHT AXILLA;  Surgeon: Rogena Class, MD;  Location: [facility];  Service: General;  Laterality: Right;   TUBAL LIGATION  11/22/2005   WISDOM TOOTH EXTRACTION     Social History    Socioeconomic History   Marital status: Divorced    Spouse name: Not on file   Number of children: 3: 45, 45, 45 - 06/2019   Years of education: 16   Highest education level: Not on file  Occupational History   RN  hospice   Tobacco Use   Smoking status: Current Every Day Smoker    Packs/day: 0.50    Years: 7.00    Pack years: 3.50    Types: Cigarettes   Smokeless tobacco: Never Used  Substance and Sexual Activity   Alcohol use: No    Alcohol/week: 0.0 standard drinks   Drug use: No   Sexual activity: Yes    Partners: Male    Birth control/protection: Condom    Comment: pt. declined condoms  Other Topics Concern   Not on file  Social History Narrative   Not on file   Social Determinants of Health   Financial Resource Strain:    Difficulty of Paying Living Expenses:   Food Insecurity:    Worried About Programme researcher, broadcasting/film/video in the Last Year:    Barista in the Last Year:   Transportation Needs:    Freight forwarder (Medical):    Lack of Transportation (Non-Medical):   Physical Activity:    Days of Exercise per Week:    Minutes of Exercise per Session:   Stress:    Feeling of Stress :   Social Connections:    Frequency of Communication with Friends and Family:    Frequency of Social Gatherings with Friends and Family:    Attends Religious Services:    Active Member of Clubs or Organizations:    Attends Engineer, structural:    Marital Status:   Intimate Partner Violence:    Fear of Current or Ex-Partner:    Emotionally Abused:    Physically Abused:    Sexually Abused:    Current Outpatient Medications on File Prior to Visit  Medication Sig Dispense Refill   aspirin EC 81 MG tablet Take 81 mg by mouth daily. Swallow whole.     Continuous Glucose Sensor (FREESTYLE LIBRE 3 PLUS SENSOR) MISC Inject 1 Device into the skin continuous. Change every 15 days 6 each 3   Continuous Glucose Sensor (FREESTYLE LIBRE 3 SENSOR) MISC Apply sensor every  14 days as directed. 6 each 3   elvitegravir-cobicistat-emtricitabine-tenofovir (GENVOYA ) 150-150-200-10 MG TABS tablet Take 1 tablet by mouth daily. 30 tablet 11   gabapentin  (NEURONTIN ) 300 MG capsule Take 1 capsule (300 mg total) by mouth 3 (three) times daily. 270 capsule 11   insulin  glargine, 1 Unit Dial , (TOUJEO  SOLOSTAR) 300 UNIT/ML  Solostar Pen Inject 16 Units into the skin daily. 4.5 mL 3   omeprazole (PRILOSEC) 20 MG capsule Take 20 mg by mouth daily.     pravastatin  (PRAVACHOL ) 20 MG tablet Take 2 tablets (40 mg total) by mouth daily. 180 tablet 3   Probiotic Product (ALIGN PO) Take by mouth.     tirzepatide  (MOUNJARO ) 10 MG/0.5ML Pen Inject 10 mg into the skin once a week. 6 mL 3   No current facility-administered medications on file prior to visit.   Allergies  Allergen Reactions   Shellfish Allergy Shortness Of Breath   Hydrocodone  Nausea And Vomiting   Family History  Problem Relation Age of Onset   Lung cancer Mother    Cancer Maternal Grandmother    Cancer Maternal Grandfather    Diabetes Maternal Uncle    Esophageal cancer Neg Hx    Liver disease Neg Hx    Colon cancer Neg Hx    Colon polyps Neg Hx    PE: BP 118/70   Pulse 84   Ht 5' 3 (1.6 m)   Wt 237 lb 12.8 oz (107.9 kg)   SpO2 98%   BMI 42.12 kg/m  Wt Readings from Last 10 Encounters:  09/14/23 237 lb 12.8 oz (107.9 kg)  08/02/23 240 lb (108.9 kg)  06/03/23 240 lb 3.2 oz (109 kg)  04/22/23 240 lb (108.9 kg)  08/24/22 235 lb 12.8 oz (107 kg)  01/26/22 235 lb (106.6 kg)  01/20/22 234 lb 9.6 oz (106.4 kg)  01/15/22 235 lb (106.6 kg)  06/15/21 214 lb 12.8 oz (97.4 kg)  03/05/21 206 lb 9.6 oz (93.7 kg)   Constitutional: overweight, in NAD Eyes:  EOMI, no exophthalmos ENT: no neck masses, no cervical lymphadenopathy Cardiovascular: RRR, No MRG Respiratory: CTA B Musculoskeletal: no deformities Skin:no rashes Neurological: no tremor with outstretched hands  ASSESSMENT: 1. DM,  insulin -dependent, uncontrolled, with complications - PN  We r/o type 1 diabetes: Component     Latest Ref Rng & Units 08/14/2019  C-Peptide     0.8- 3.85 ng/mL 1.09  Glucose, Plasma     65 - 99 mg/dL 93  Islet Cell Ab     AOZ:<3:0 Negative  ZNT8 Antibodies     <15 U/mL <10  Glutamic Acid Decarb Ab     <5 IU/mL <5   2. HL  3.  Obesity class III  PLAN:  1. Patient with longstanding, uncontrolled, type 2 diabetes, with history of noncompliance with medications and visits due to problems with insurance and also being overwhelmed by her diabetes.  Earlier in the year, her HbA1c was quite high, at 10.7%.  At last visit, reviewing the CGM trends, sugars appears to be fluctuating above the upper limit of the target range with increases after breakfast and dinner and highest blood sugars in the middle of the night.  I advised her to increase both Toujeo  and Mounjaro  doses.  She previously had nausea with Mounjaro  and I sent a prescription for few tablets of Zofran  for her.  Her nausea resolved since then despite the fact that we ended up increasing the Mounjaro  dose last week. CGM interpretation: -At today's visit, we reviewed her CGM downloads: It appears that 82% of values are in target range (goal >70%), while 18% are higher than 180 (goal <25%), and 0% are lower than 70 (goal <4%).  The calculated average blood sugar is 157.  The projected HbA1c for the next 3 months (GMI) is 7.1%. -Reviewing the CGM trends,  sugars appear to be significantly improved, mostly fluctuating within the upper half of the target range.  She just increased the dose of Mounjaro , which she tolerates well.  I anticipate that the sugars will continue to improve on the 10 mg dose.  She had to back off the Toujeo  dose after she had some lower blood sugars, under 70s.  Will continue with the lower dose.  I did advise her to let me know if she has more lows, in which case we can reduce the insulin  dose more. - I suggested to:   Patient Instructions  Please continue: - Toujeo  25 units daily - Mounjaro  10 mg weekly  Please return in 3-4 months.  - we checked her HbA1c: 7.4% (lower) - advised to check sugars at different times of the day - 4x a day, rotating check times - advised for yearly eye exams >> she is UTD - return to clinic in 3-4 months  2. HL - LDL and triglycerides were elevated at last check: Lab Results  Component Value Date   CHOL 197 04/22/2023   HDL 51 04/22/2023   LDLCALC 116 (H) 04/22/2023   TRIG 178 (H) 04/22/2023   CHOLHDL 3.9 04/22/2023  - After the above results returned, I advised her to increase pravastatin  from 20 to 40 mg daily.  She is taking the higher dose now.  3.  Obesity class III -She tried a weight management clinic before and was given phentermine  but she did not feel well on the program and stopped. -Unfortunately, she had abdominal pain, nausea, and vomiting with Ozempic  so we had to stop.  I then recommended Trulicity .  However, she was lost for follow-up afterwards.  At last visit I recommended to start Mounjaro  and increase the dose as tolerated.  -We previously also discussed about a referral to the [facility] weight management clinic and she agreed with this.  While, she did not be established care yet. - She lost 3 pounds since last visit  Emilie Harden, MD PhD [facility]

## 2023-09-14 NOTE — Addendum Note (Signed)
 Addended by: Vernon Goodpasture on: 09/14/2023 04:38 PM   Modules accepted: Orders

## 2023-09-14 NOTE — Patient Instructions (Addendum)
 Please continue: - Toujeo  14 units daily - Mounjaro  10 mg weekly  Please return in 3-4 months.

## 2023-09-15 ENCOUNTER — Ambulatory Visit: Payer: PRIVATE HEALTH INSURANCE | Admitting: Internal Medicine

## 2023-11-11 ENCOUNTER — Other Ambulatory Visit: Payer: Self-pay | Admitting: Internal Medicine

## 2023-11-11 DIAGNOSIS — G63 Polyneuropathy in diseases classified elsewhere: Secondary | ICD-10-CM

## 2023-11-11 NOTE — Telephone Encounter (Signed)
 Patient has PCP.

## 2023-11-18 ENCOUNTER — Other Ambulatory Visit: Payer: Self-pay | Admitting: Internal Medicine

## 2023-11-18 DIAGNOSIS — G63 Polyneuropathy in diseases classified elsewhere: Secondary | ICD-10-CM

## 2023-11-23 ENCOUNTER — Encounter: Payer: Self-pay | Admitting: Internal Medicine

## 2023-11-24 ENCOUNTER — Other Ambulatory Visit: Payer: Self-pay | Admitting: Internal Medicine

## 2023-11-24 ENCOUNTER — Other Ambulatory Visit (HOSPITAL_COMMUNITY): Payer: Self-pay

## 2023-11-24 ENCOUNTER — Other Ambulatory Visit: Payer: Self-pay

## 2023-11-24 DIAGNOSIS — G63 Polyneuropathy in diseases classified elsewhere: Secondary | ICD-10-CM

## 2023-11-24 MED ORDER — GABAPENTIN 300 MG PO CAPS
300.0000 mg | ORAL_CAPSULE | Freq: Three times a day (TID) | ORAL | 1 refills | Status: AC
  Filled 2023-11-24: qty 90, 30d supply, fill #0

## 2023-12-26 ENCOUNTER — Other Ambulatory Visit: Payer: Self-pay | Admitting: Internal Medicine

## 2023-12-26 ENCOUNTER — Other Ambulatory Visit (HOSPITAL_COMMUNITY): Payer: Self-pay

## 2023-12-26 DIAGNOSIS — G63 Polyneuropathy in diseases classified elsewhere: Secondary | ICD-10-CM

## 2023-12-27 NOTE — Telephone Encounter (Signed)
Being prescribed by neurologist.

## 2024-01-17 ENCOUNTER — Encounter: Payer: Self-pay | Admitting: Internal Medicine

## 2024-01-17 ENCOUNTER — Ambulatory Visit: Payer: PRIVATE HEALTH INSURANCE | Admitting: Internal Medicine

## 2024-01-17 ENCOUNTER — Other Ambulatory Visit: Payer: PRIVATE HEALTH INSURANCE

## 2024-01-17 VITALS — BP 124/70 | HR 99 | Ht 63.0 in | Wt 237.2 lb

## 2024-01-17 DIAGNOSIS — E782 Mixed hyperlipidemia: Secondary | ICD-10-CM

## 2024-01-17 DIAGNOSIS — E1140 Type 2 diabetes mellitus with diabetic neuropathy, unspecified: Secondary | ICD-10-CM | POA: Diagnosis not present

## 2024-01-17 DIAGNOSIS — G63 Polyneuropathy in diseases classified elsewhere: Secondary | ICD-10-CM

## 2024-01-17 DIAGNOSIS — Z794 Long term (current) use of insulin: Secondary | ICD-10-CM | POA: Diagnosis not present

## 2024-01-17 LAB — POCT GLYCOSYLATED HEMOGLOBIN (HGB A1C): Hemoglobin A1C: 8.1 % — AB (ref 4.0–5.6)

## 2024-01-17 MED ORDER — MOUNJARO 12.5 MG/0.5ML ~~LOC~~ SOAJ: 12.5000 mg | SUBCUTANEOUS | 3 refills | Status: AC

## 2024-01-17 MED ORDER — TOUJEO SOLOSTAR 300 UNIT/ML ~~LOC~~ SOPN: 16.0000 [IU] | PEN_INJECTOR | Freq: Every day | SUBCUTANEOUS | 3 refills | Status: AC

## 2024-01-17 NOTE — Addendum Note (Signed)
 Addended by: CLEOTILDE ROLIN RAMAN on: 01/17/2024 04:18 PM   Modules accepted: Orders

## 2024-01-17 NOTE — Progress Notes (Signed)
 Patient ID: Melissa Cruz, female   DOB: 1980-05-05, 43 y.o.   MRN: [id number]   HPI: Melissa Cruz is a 43 y.o.-year-old female, initially referred by Dr. Efrain, returning for follow-up for DM, dx in 2014, GDM in ~2008, insulin -dependent since 2018, uncontrolled, with long-term complications (PN).  She saw endocrinology in the past (Dr. Tommas), but I do not have these records.  Last visit 4 months ago. New PCP - Alberta Hall, FNP - [place].  Interim history: No increased urination, blurry vision, chest pain.  She continues to be quite busy.  She works nights.  She is still in school but will graduate next year. She is tired. She had endometrial ablation in 11/2023. She started iron  and vitamin D.  Reviewed HbA1c levels: Lab Results  Component Value Date   HGBA1C 7.4 (A) 09/14/2023   HGBA1C 10.7 (A) 04/22/2023   HGBA1C 7.7 (A) 01/20/2022   HGBA1C 6.6 (A) 06/15/2021   HGBA1C 7.1 (A) 03/05/2021   HGBA1C 7.5 (A) 09/30/2020   HGBA1C 6.6 (A) 06/03/2020   HGBA1C 6.6 (A) 01/28/2020   HGBA1C 6.4 (A) 10/16/2019   HGBA1C 12.1 (A) 06/28/2019  She was tested for insulin  deficiency >> she did have a low C-peptide (no records)  Previously on: She was on NovoLog 70/30 40 units 2x a day. She tried Metformin  IR and ER >> significant diarrhea.   Then on: - Tresiba  20 >> 28 >> 20 >> 12 units daily >> stopped 11/2020 >> restarted 06/2021: 14 >> 10 units daily - Ozempic  0.5 >> 1 mg weekly in a.m. >> significant GI symptoms >> Trulicity  0.75 mg weekly  At last visit, we changed to: - Tresiba  15 >> Toujeo  20 >> 14 units daily - Mounjaro  2.5 mg weekly -nausea when she started, but resolved >> 5 >> 7.5 >> 10 mg weekly   She is checking her blood sugars 4 times a day with her CGM:    Previously:  Previously:   Lowest sugar was 59 (higher Toujeo  dose) >> 70s; she has hypoglycemia awareness in the 80s. Highest sugar was 380 ... >> 300 >> 200s.  Glucometer: One Touch  Verio  Pt's meals are: - Breakfast: cereals, eggs, sausage, toast, pancakes (dinner for her) - Lunch: soup, sub, chicken, salad - Dinner:chicken, fish, fries - Snacks: 3-4 She tried Keto diet for 3 mo >> lost weight >> could come off insulin .  No CKD, last BUN/creatinine:  Lab Results  Component Value Date   BUN 9 07/25/2023   BUN 9 08/10/2022   CREATININE 0.50 07/25/2023   CREATININE 0.54 08/10/2022   Lab Results  Component Value Date   MICRALBCREAT 21 04/22/2023   MICRALBCREAT 2.6 10/05/2012  Not on ACE inhibitor/ARB  + HL; last set of lipids: Lab Results  Component Value Date   CHOL 197 04/22/2023   HDL 51 04/22/2023   LDLCALC 116 (H) 04/22/2023   TRIG 178 (H) 04/22/2023   CHOLHDL 3.9 04/22/2023  We started pravastatin  20 mg daily in 01/2020.  - last eye exam was in 01/2023: Reportedly no DR.  - no numbness and tingling in her feet.  Last foot exam 10/27/2023 (Dr. Orland): Vascular: Palpable dorsalis pedis pulse right. Palpable dorsalis pedis pulse left. Palpable posterior tibial pulse right . Palpable posterior tibial pulse left. No pallor on elevation or dependent rubor. Integument: Warm supple skin, positive hair growth on the toes, no signs of ulceration or clubbing. Incurvated hallux nail borders bilateral, right worse than left. Increased thickness, discoloration,  subungual debris noted to hallux nail bilateral. Neurological: Intact sensation via light touch bilateral. Protective sensation intact via SWMF. Musculoskeletal: Normal muscle mass and tone symmetric, bilateral.  She feels that the Mycolog cream that I prescribed for her worked very well.  Pt has FH of DM in 2 uncles.  She has a history of HIV infection, for which she was seeing Dr. Efrain, now Dr. Dea. She developed anemia in the past with pica for ice due to multiple fibroids. She was started on Tranexamic acid  with her cycles.  ROS: + see HPI  Past Medical History:  Diagnosis Date    Anemia    History of anemia    HIV positive (HCC)    Hydradenitis 02/2013   right axilla   Non-insulin  dependent type 2 diabetes mellitus (HCC)    gestational only per patient   Past Surgical History:  Procedure Laterality Date   CHOLECYSTECTOMY  1999   HYDRADENITIS EXCISION Right 02/14/2013   Procedure: WIDE EXCISION HIDRADENITIS RIGHT AXILLA;  Surgeon: Vicenta DELENA Poli, MD;  Location: [facility];  Service: General;  Laterality: Right;   TUBAL LIGATION  11/22/2005   WISDOM TOOTH EXTRACTION     Social History   Socioeconomic History   Marital status: Divorced    Spouse name: Not on file   Number of children: 3: 21, 48, 38 - 06/2019   Years of education: 16   Highest education level: Not on file  Occupational History   RN  hospice   Tobacco Use   Smoking status: Current Every Day Smoker    Packs/day: 0.50    Years: 7.00    Pack years: 3.50    Types: Cigarettes   Smokeless tobacco: Never Used  Substance and Sexual Activity   Alcohol use: No    Alcohol/week: 0.0 standard drinks   Drug use: No   Sexual activity: Yes    Partners: Male    Birth control/protection: Condom    Comment: pt. declined condoms  Other Topics Concern   Not on file  Social History Narrative   Not on file   Social Determinants of Health   Financial Resource Strain:    Difficulty of Paying Living Expenses:   Food Insecurity:    Worried About Programme researcher, broadcasting/film/video in the Last Year:    Barista in the Last Year:   Transportation Needs:    Freight forwarder (Medical):    Lack of Transportation (Non-Medical):   Physical Activity:    Days of Exercise per Week:    Minutes of Exercise per Session:   Stress:    Feeling of Stress :   Social Connections:    Frequency of Communication with Friends and Family:    Frequency of Social Gatherings with Friends and Family:    Attends Religious Services:    Active Member of Clubs or Organizations:    Attends Museum/gallery exhibitions officer:    Marital Status:   Intimate Partner Violence:    Fear of Current or Ex-Partner:    Emotionally Abused:    Physically Abused:    Sexually Abused:    Current Outpatient Medications on File Prior to Visit  Medication Sig Dispense Refill   ascorbic acid (VITAMIN C) 500 MG tablet Take 500 mg by mouth daily.     aspirin EC 81 MG tablet Take 81 mg by mouth daily. Swallow whole.     Continuous Glucose Sensor (FREESTYLE LIBRE 3 PLUS SENSOR) MISC Inject 1  Device into the skin continuous. Change every 15 days 6 each 3   Continuous Glucose Sensor (FREESTYLE LIBRE 3 SENSOR) MISC Apply sensor every 14 days as directed. 6 each 3   elvitegravir-cobicistat-emtricitabine-tenofovir (GENVOYA ) 150-150-200-10 MG TABS tablet Take 1 tablet by mouth daily. 30 tablet 11   ferrous sulfate  325 (65 FE) MG EC tablet Take 325 mg by mouth daily with breakfast.     gabapentin  (NEURONTIN ) 300 MG capsule Take 1 capsule (300 mg total) by mouth 3 (three) times daily. 270 capsule 1   insulin  glargine, 1 Unit Dial , (TOUJEO  SOLOSTAR) 300 UNIT/ML Solostar Pen Inject 16 Units into the skin daily. 4.5 mL 3   omeprazole (PRILOSEC) 20 MG capsule Take 20 mg by mouth daily.     pravastatin  (PRAVACHOL ) 40 MG tablet Take 1 tablet (40 mg total) by mouth daily.     Probiotic Product (ALIGN PO) Take by mouth.     tirzepatide  (MOUNJARO ) 10 MG/0.5ML Pen Inject 10 mg into the skin once a week. 6 mL 3   Vitamin D, Ergocalciferol, (DRISDOL) 1.25 MG (50000 UNIT) CAPS capsule Take 50,000 Units by mouth once a week.     No current facility-administered medications on file prior to visit.   Allergies  Allergen Reactions   Shellfish Allergy Shortness Of Breath   Hydrocodone  Nausea And Vomiting   Family History  Problem Relation Age of Onset   Lung cancer Mother    Cancer Maternal Grandmother    Cancer Maternal Grandfather    Diabetes Maternal Uncle    Esophageal cancer Neg Hx    Liver disease Neg Hx    Colon  cancer Neg Hx    Colon polyps Neg Hx    PE: BP 124/70   Pulse 99   Ht 5' 3 (1.6 m)   Wt 237 lb 3.2 oz (107.6 kg)   SpO2 98%   BMI 42.02 kg/m  Wt Readings from Last 10 Encounters:  01/17/24 237 lb 3.2 oz (107.6 kg)  09/14/23 237 lb 12.8 oz (107.9 kg)  08/02/23 240 lb (108.9 kg)  06/03/23 240 lb 3.2 oz (109 kg)  04/22/23 240 lb (108.9 kg)  08/24/22 235 lb 12.8 oz (107 kg)  01/26/22 235 lb (106.6 kg)  01/20/22 234 lb 9.6 oz (106.4 kg)  01/15/22 235 lb (106.6 kg)  06/15/21 214 lb 12.8 oz (97.4 kg)   Constitutional: overweight, in NAD Eyes:  EOMI, no exophthalmos ENT: no neck masses, no cervical lymphadenopathy Cardiovascular: tachycardia, RR, No MRG Respiratory: CTA B Musculoskeletal: no deformities Skin:no rashes Neurological: no tremor with outstretched hands  ASSESSMENT: 1. DM, insulin -dependent, uncontrolled, with complications - PN  We r/o type 1 diabetes: Component     Latest Ref Rng & Units 08/14/2019  C-Peptide     0.8- 3.85 ng/mL 1.09  Glucose, Plasma     65 - 99 mg/dL 93  Islet Cell Ab     Wzh:<8:8 Negative  ZNT8 Antibodies     <15 U/mL <10  Glutamic Acid Decarb Ab     <5 IU/mL <5   2. HL  3.  Obesity class III  PLAN:  1. Patient with longstanding, uncontrolled, type 2 diabetes, with history of noncompliance with medications and visits due to problems with insurance and also being overwhelmed by her diabetes.  Earlier this year, HbA1c increased to 10.7%.  He is currently on daily long-acting insulin  and weekly GLP-1/GIP receptor agonist.  She previously had nausea with Mounjaro  and I sent a prescription for few tablets  of Zofran  for her.  Her nausea resolved.  At last visit sugars appears to be significantly improved, mostly fluctuating within the upper half of the target range after increasing the dose of Mounjaro .  She has decreased the dose of due to improving blood sugars and I advised her to continue the same dose. CGM interpretation: -At today's  visit, we reviewed her CGM downloads: It appears that 89% of values are in target range (goal >70%), while 11% are higher than 180 (goal <25%), and 0% are lower than 70 (goal <4%).  The calculated average blood sugar is 150.  The projected HbA1c for the next 3 months (GMI) is 6.8-6.9%. -Reviewing the CGM trends, she had the sensor off for approximately 10 days, but sugars are very well-controlled after she restarted with only occasional hyperglycemic spikes.  The predicted HbA1c from the sensor is much lower than the actual HbA1c which returned higher today (see below).  She is interested in increasing the dose of Mounjaro  and since her weight has been stable now for a while and HbA1c is higher, and also since she does not have nausea anymore, I agreed to increase the dose to 12.5 mg weekly.  Will continue the same dose of insulin  for now. - I suggested to:  Patient Instructions  Please continue: - Toujeo  14 units daily  You can increase: - Mounjaro  12.5 mg weekly  Please return in 3 months.  - we checked her HbA1c: 8.1% (higher) - advised to check sugars at different times of the day - 4x a day, rotating check times - advised for yearly eye exams >> she is UTD - return to clinic in 3 months  2. HL - Reviewed latest lipid panel from 04/2023: LDL and triglycerides above target. Lab Results  Component Value Date   CHOL 197 04/22/2023   HDL 51 04/22/2023   LDLCALC 116 (H) 04/22/2023   TRIG 178 (H) 04/22/2023   CHOLHDL 3.9 04/22/2023  - Currently on pravastatin  40 mg daily, increased after the above results returned  3.  Obesity class III -She tried a weight management clinic before and was given phentermine  but she did not feel well on the program and stopped. -Unfortunately, she had abdominal pain, nausea, and vomiting with Ozempic  so we had to stop.  I then recommended Trulicity .  However, she was lost for follow-up afterwards.  At last visit I recommended to start Mounjaro  and increase  the dose as tolerated.  -We previously also discussed about a referral to the [facility] weight management clinic and she agreed with this.  While, she did not be established care yet. - She lost 3 pounds before last visit but weight is stable now  Lela Fendt, MD PhD [facility]

## 2024-01-17 NOTE — Patient Instructions (Addendum)
 Please continue: - Toujeo  14 units daily  You can increase: - Mounjaro  12.5 mg weekly  Please return in 3 months.

## 2024-01-18 ENCOUNTER — Ambulatory Visit: Payer: Self-pay | Admitting: Internal Medicine

## 2024-01-18 LAB — MICROALBUMIN / CREATININE URINE RATIO
Creatinine, Urine: 222 mg/dL (ref 20–275)
Microalb Creat Ratio: 5 mg/g{creat} (ref <30)
Microalb, Ur: 1 mg/dL

## 2024-03-05 NOTE — Progress Notes (Signed)
 The 10-year ASCVD risk score (Arnett DK, et al., 2019) is: 2.5%   Values used to calculate the score:     Age: 43 years     Clincally relevant sex: Female     Is Non-Hispanic African American: Yes     Diabetic: Yes     Tobacco smoker: No     Systolic Blood Pressure: 124 mmHg     Is BP treated: No     HDL Cholesterol: 51 mg/dL     Total Cholesterol: 197 mg/dL  Currently prescribed pravastatin  20 mg.  Achsah Mcquade, BSN, RN

## 2024-04-18 ENCOUNTER — Ambulatory Visit: Payer: PRIVATE HEALTH INSURANCE | Admitting: Internal Medicine

## 2024-05-07 ENCOUNTER — Ambulatory Visit: Payer: PRIVATE HEALTH INSURANCE | Admitting: Internal Medicine

## 2024-06-04 ENCOUNTER — Ambulatory Visit: Payer: PRIVATE HEALTH INSURANCE | Admitting: Internal Medicine
